# Patient Record
Sex: Female | Born: 1975
Health system: Southern US, Community
[De-identification: ages and names within clinical notes are randomized; demographics above are authoritative.]

## PROBLEM LIST (undated history)

## (undated) DIAGNOSIS — J302 Other seasonal allergic rhinitis: Secondary | ICD-10-CM

## (undated) HISTORY — PX: RECONSTRUCTION, SEPTAL: SHX5049

## (undated) HISTORY — DX: Other seasonal allergic rhinitis: J30.2

## (undated) HISTORY — PX: FOOT SURGERY: SHX648

## (undated) HISTORY — PX: DENTAL SURGERY: SHX609

---

## 2015-01-05 ENCOUNTER — Ambulatory Visit (INDEPENDENT_AMBULATORY_CARE_PROVIDER_SITE_OTHER): Payer: No Typology Code available for payment source | Admitting: Family

## 2015-01-05 ENCOUNTER — Encounter (INDEPENDENT_AMBULATORY_CARE_PROVIDER_SITE_OTHER): Payer: Self-pay

## 2015-01-05 VITALS — BP 124/68 | HR 77 | Temp 99.4°F | Ht 60.0 in | Wt 105.0 lb

## 2015-01-05 DIAGNOSIS — B349 Viral infection, unspecified: Secondary | ICD-10-CM

## 2015-01-05 DIAGNOSIS — R52 Pain, unspecified: Secondary | ICD-10-CM

## 2015-01-05 NOTE — Patient Instructions (Signed)
Viral Syndrome (Adult)  A viral illness may cause a number of symptoms. The symptoms depend on the part of the body that the virus affects. If it settles in the nose, throat, and lungs, it may cause cough, sore throat, congestion, and sometimes headache. If it settles in the stomach and intestinal tract, it may cause vomiting and diarrhea. Sometimes it causes vague symptoms like "aching all over," feeling tired, loss of appetite, or fever.  A viral illness usually lasts1 to 2 weeks, but sometimes it lasts longer. In some cases, a more serious infection can look like a viral syndrome in the first few days of the illness. You may need anotherexam and additional teststo know the difference.Watch for the warning signs listed below.  Home care  Follow these guidelines for taking care of yourself at home:   If symptoms are severe, rest at home for the first 2 to 3 days.   Stay away from cigarette smoke - both your smoke and the smoke from others.   You may useacetaminophen or ibuprofen for fever, muscle aching, and headache, unless another medicine was prescribed for this.If you have chronic liver or kidney disease or ever had a stomach ulcer or GI bleeding, talk with your doctor before using these medicinesNo one who is younger than 71 and ill with a fever should take aspirin. It may cause severe liver damage.   Your appetite may be poor, so a light diet is fine. Avoid dehydration by drinking 8 to 12 8-ounce glasses of fluids each day. This may include water; orange juice; lemonade; apple, grape, and cranberry juice; clear fruit drinks; electrolyte replacement and sports drinks; and decaffeinated teas and coffee. If you have been diagnosed with a kidney disease, ask your doctor how much and what types of fluids you should drink to prevent dehydration. If you have kidney disease, drinking too much fluid can cause it build up in the your body and be dangerous to your health.   Over-the-counter remedies won't  shorten the length of the illness but may be helpful forcough, sore throat; and nasal and sinus congestion. Don't use decongestants if you have high blood pressure.  Follow-up care  Follow up with your health care provider if you do not improve over the next week.  When to seek medical advice  Call your health care provider right awayif any of these occur:   Cough with lots of colored sputum (mucus) or blood in your sputum   Chest pain, shortness of breath, wheezing, or difficulty breathing   Severe headache; face, neck, or ear pain   Severe, constant pain in the lower right side of your belly (abdominal)   Continued vomiting (can't keep liquids down)   Frequent diarrhea (more than 5 times a day); blood (red or black color) or mucus in diarrhea   Feeling weak, dizzy, or like you are going to faint   Extreme thirst   Fever of 100.4 F (38 C) oral or higher, not better with fever medication   [employer]   2000-2015 [employer] [address] [place], [place] [address]. All rights reserved. This information is not intended as a substitute for professional medical care. Always follow your healthcare professional's instructions.    Follow up with Primary physician for concerns about zika or the department of health.

## 2015-01-05 NOTE — Progress Notes (Signed)
Subjective:       Patient ID: Leah Morgan is a 39 y.o. female.    HPI Comments: Pt presents with c/o body aches in upper arms for 2-3 days.   She had a low grade fever yesterday of 100.0.   She denies any cold symptoms.   She is trying to get pregnant. Worried about zika, given exposure to mosquito bite while in [place] about a month ago for her honeymoon.             Review of Systems   Constitutional: Positive for fever. Negative for chills, diaphoresis, activity change, appetite change and fatigue.   HENT: Positive for ear pain. Negative for congestion, drooling, ear discharge and facial swelling.    Eyes: Negative.  Negative for pain, discharge and itching.   Respiratory: Negative.    Cardiovascular: Negative for chest pain, palpitations and leg swelling.   Gastrointestinal: Negative.    Genitourinary: Negative for dysuria, urgency, frequency, flank pain, decreased urine volume and pelvic pain.   Musculoskeletal: Positive for myalgias. Negative for back pain, joint swelling, neck pain and neck stiffness.   Allergic/Immunologic: Negative for environmental allergies and food allergies.   Neurological: Negative for dizziness, tremors, seizures, syncope, facial asymmetry, speech difficulty, weakness, light-headedness, numbness and headaches.           Objective:     Physical Exam   Constitutional: She appears well-developed.   HENT:   Head: Normocephalic.   Right Ear: Hearing, tympanic membrane and external ear normal.   Left Ear: Hearing, tympanic membrane and external ear normal.   Nose: No mucosal edema, rhinorrhea, nose lacerations, sinus tenderness or nasal deformity.   Mouth/Throat: Uvula is midline, oropharynx is clear and moist and mucous membranes are normal. No oral lesions. No dental abscesses.   Eyes: Conjunctivae, EOM and lids are normal. Pupils are equal, round, and reactive to light.   Neck: Normal range of motion. Normal carotid pulses present. No spinous process tenderness and no muscular  tenderness present. Carotid bruit is not present. No rigidity. No edema, no erythema and normal range of motion present.   Cardiovascular: Normal rate and normal pulses.    Pulmonary/Chest: Effort normal and breath sounds normal.   Abdominal: Soft.   Musculoskeletal: Normal range of motion. She exhibits no edema.        Right shoulder: She exhibits tenderness, pain and abnormal pulse. She exhibits normal range of motion, no bony tenderness, no swelling, no effusion, no crepitus, no deformity, no laceration, no spasm and normal strength.   Bilateral arm muscular tenderness with palpation of muscles around biceps and triceps.    Lymphadenopathy:     She has no cervical adenopathy.     She has no axillary adenopathy.   Neurological: She is alert. She displays no atrophy, no tremor and normal reflexes. No cranial nerve deficit or sensory deficit. She exhibits normal muscle tone. Coordination normal.   Skin: Skin is warm. No rash noted. She is not diaphoretic. No erythema.   Psychiatric: She has a normal mood and affect. Her speech is normal and behavior is normal. Thought content normal.           Assessment:       Viral syndrome, body aches      Plan:       Tylenol as needed.   Primary physician list for [place] given to patient. F/u and schedule with PMD or with health department for [facility] testing.  Subjective:      Leah Morgan is a 39 y.o. female who presents for evaluation of bilateral hand/finger pain. Onset was sudden, not related to any specific activity. The pain is moderate, worsens with movement, and is relieved by rest. There is no associated numbness. Evaluation to date: none. Treatment to date: nothing specific.    The following portions of the patient's history were reviewed and updated as appropriate: She  has a past medical history of Seasonal allergic rhinitis..    Review of Systems  Pertinent items are noted in HPI.      Objective:        Right hand:  Tenderness of bilateral hands, muscle aches.  ROM intact   Left hand:  Tenderness of bilateral hands, muscle aches. ROM intact         Assessment:      viral syndrome      Plan:      OTC analgesics as needed.    F/u with PMD as listed for further testing as needed if symptoms persist or worsen

## 2015-01-05 NOTE — Progress Notes (Signed)
Patient feels as if she worked out but has not. Went dancing this past weekend but does not know whether it could be related to that.

## 2015-01-06 ENCOUNTER — Telehealth (INDEPENDENT_AMBULATORY_CARE_PROVIDER_SITE_OTHER): Payer: Self-pay

## 2015-01-06 LAB — POCT PREGNANCY TEST, URINE HCG: POCT Pregnancy HCG Test, UR: NEGATIVE

## 2015-01-06 NOTE — Telephone Encounter (Signed)
F/U Call, pt feeling about the same. Advised pt to try and rest for the next couple days and f/u if needed

## 2018-07-14 ENCOUNTER — Ambulatory Visit (INDEPENDENT_AMBULATORY_CARE_PROVIDER_SITE_OTHER): Payer: No Typology Code available for payment source | Admitting: Family Nurse Practitioner

## 2018-07-14 ENCOUNTER — Ambulatory Visit (INDEPENDENT_AMBULATORY_CARE_PROVIDER_SITE_OTHER): Payer: No Typology Code available for payment source

## 2018-07-14 ENCOUNTER — Encounter (INDEPENDENT_AMBULATORY_CARE_PROVIDER_SITE_OTHER): Payer: Self-pay

## 2018-07-14 VITALS — BP 106/66 | HR 76 | Temp 98.9°F | Resp 16 | Ht 60.0 in | Wt 103.0 lb

## 2018-07-14 DIAGNOSIS — R059 Cough, unspecified: Secondary | ICD-10-CM

## 2018-07-14 DIAGNOSIS — R05 Cough: Secondary | ICD-10-CM

## 2018-07-14 DIAGNOSIS — J209 Acute bronchitis, unspecified: Secondary | ICD-10-CM

## 2018-07-14 MED ORDER — BENZONATATE 100 MG PO CAPS
100.00 mg | ORAL_CAPSULE | Freq: Three times a day (TID) | ORAL | 0 refills | Status: AC | PRN
Start: 2018-07-14 — End: 2018-07-21

## 2018-07-14 MED ORDER — ALBUTEROL SULFATE HFA 108 (90 BASE) MCG/ACT IN AERS
2.0000 | INHALATION_SPRAY | Freq: Four times a day (QID) | RESPIRATORY_TRACT | 0 refills | Status: DC | PRN
Start: 2018-07-14 — End: 2018-07-24

## 2018-07-14 MED ORDER — ALBUTEROL-IPRATROPIUM 2.5-0.5 (3) MG/3ML IN SOLN
3.00 mL | Freq: Once | RESPIRATORY_TRACT | Status: AC
Start: 2018-07-14 — End: 2018-07-14
  Administered 2018-07-14: 13:00:00 3 mL via RESPIRATORY_TRACT

## 2018-07-14 NOTE — Patient Instructions (Signed)
Acute Bronchitis  Your healthcare provider has told you that you have acute bronchitis. Bronchitis is infection or inflammation of the airways in the lungs (bronchial tubes). Normally, air moves easily in and out of the airways. Bronchitis narrows the airways. This makes it harder for air to flow in and out of the lungs. This causes symptoms such as shortness of breath, coughing up yellow or green mucus, and wheezing.   Bronchitis can be acute or chronic. Acute means it happens quickly and goes away in a short time. Chronic means a condition lasts a long time and often comes back. Most people with acute bronchitis get better in 1 to 2 weeks.     What causes acute bronchitis?  Acute bronchitis is often caused by a virus such as a cold or the flu. In some cases, it may be caused by bacteria. Certain factors make it more likely for a cold or flu to turn into bronchitis. These include being very young, being elderly, having a heart or lung problem, or having a weak immune system. Cigarette smoking also makes bronchitis more likely.   When bronchitis develops, the airways become swollen. The airways may also become infected with bacteria. This is known as a secondary infection.   Symptoms of acute bronchitis  Symptoms can include:   Coughing with mucus   Wheezing   Feeling short of breath   Chest pain   Fever  Diagnosing acute bronchitis  Your healthcare provider will ask about your symptoms and health history. He or she will give you a physical exam. This will include listening to your lungs while you breathe. You may have a chest X-ray to look for infection in the lungs (pneumonia) if you have had a fever. You may also have blood tests to check for infection.   Treating acute bronchitis  Bronchitis usually goes away in 1 to 2 weeks without treatment. You can help feel better by:    Taking medicine as directed. Talk to your healthcare provider before taking any over-the-counter medicines (OTC). Some OTC  medicines help relieve inflammation in your bronchial tubes. They can also thin mucus. This makes it easier to cough up. Your healthcare provider may prescribe an inhaler to help open up the bronchial tubes. Most of the time,acute bronchitisis caused by a viral infection. Antibiotics are usually not prescribed for viral infections.   Drinking plenty of fluids, such as water, juice, or warm soup. Fluids loosen mucus so that you can cough it up. This helps you breathe more easily. Fluids also prevent dehydration.   Using a humidifier. This can help reduce coughing.   Getting plenty of rest   Not smoking. Also, don't let anyone else smoke in your home. In public places, move away from secondhand smoke.  Recovery and follow-up  Follow up with your doctor. You will likely feel better in 1 to 2 weeks. But you may have a dry cough for a longer time. Let your doctor know if you still have symptoms other than a dry cough after 2 weeks. Tell him or her if you get bronchial infections often.   Self-care tips  To get relief from your symptoms and prevent bronchitis:   Stop smoking. Stopping smoking is the most important step you can take to treat bronchitis. If you need help stopping smoking, talk with your healthcare provider.   Stay away from secondhand smoke and other irritants. Try to stay away from smoke, chemicals, fumes, and dust. Don't let anyone smoke in   your home. Stay indoors on smoggy days.   Prevent lung infections. Ask your healthcare provider about the flu and pneumonia vaccines. Take steps to prevent colds and other lung infections.   Wash your hands well. Wash your hands often with soap and water. Use hand sanitizer when you can't wash your hands. Stay away from crowds during cold and flu season.  When to call your healthcare provider  Call the healthcare provider if you have any of these:    Fever of 100.4F ( 38.0C) or higher, or as directed by your healthcare provider   Symptoms that get worse,  or new symptoms   Breathing not getting better with treatments   Symptoms that don't start to get better in 1 week  [employer] last reviewed this educational content on 01/17/2018   2000-2019 [employer]. [address], [place] [address]. All rights reserved. This information is not intended as a substitute for professional medical care. Always follow your healthcare professional's instructions.

## 2018-07-14 NOTE — Progress Notes (Signed)
[facility]  PROGRESS NOTE     Patient: Leah Morgan   Date: 07/14/2018   MRN: [id number]       Leah Morgan is a 43 y.o. female      HISTORY     Chief Complaint   Patient presents with   . Cough     c/o dry cough x4 days.  she says it feels like it's settling in her chest.          Cough   This is a new problem. Episode onset: 4 days. The problem has been unchanged. The problem occurs constantly. The cough is non-productive. Associated symptoms include rhinorrhea and wheezing. Pertinent negatives include no chest pain, chills, ear congestion, ear pain, fever, headaches, heartburn, hemoptysis, myalgias, nasal congestion, postnasal drip, rash, sore throat, shortness of breath, sweats or weight loss. Nothing aggravates the symptoms. She has tried nothing for the symptoms. The treatment provided no relief. There is no history of asthma, bronchiectasis, bronchitis, COPD, emphysema, environmental allergies or pneumonia.       Review of Systems   Constitutional: Negative for chills, fever and weight loss.   HENT: Positive for rhinorrhea. Negative for ear pain, postnasal drip and sore throat.    Eyes: Negative.    Respiratory: Positive for cough and wheezing. Negative for hemoptysis and shortness of breath.    Cardiovascular: Negative for chest pain.   Gastrointestinal: Negative for heartburn.   Musculoskeletal: Negative for myalgias.   Skin: Negative for rash.   Allergic/Immunologic: Negative for environmental allergies.   Neurological: Negative for headaches.       History:  History reviewed. No pertinent past medical history.    History reviewed. No pertinent surgical history.    History reviewed. No pertinent family history.    Social History     Tobacco Use   . Smoking status: Never Smoker   . Smokeless tobacco: Never Used   Substance Use Topics   . Alcohol use: Yes     Comment: rarely   . Drug use: Never       History reviewed.        Current Outpatient Medications:   .  Ascorbic Acid (VITAMIN C) 100 MG  tablet, Take 100 mg by mouth daily, Disp: , Rfl:   .  guaifenesin (ROBITUSSIN) 100 MG/5ML liquid, Take 200 mg by mouth 3 (three) times daily as needed for Cough, Disp: , Rfl:   .  albuterol (PROVENTIL HFA) 108 (90 Base) MCG/ACT inhaler, Inhale 2 puffs into the lungs every 6 (six) hours as needed for Wheezing or Shortness of Breath, Disp: 1 Inhaler, Rfl: 0  .  benzonatate (TESSALON) 100 MG capsule, Take 1 capsule (100 mg total) by mouth 3 (three) times daily as needed for Cough, Disp: 21 capsule, Rfl: 0  No current facility-administered medications for this visit.     No Known Allergies    Medications and Allergies reviewed.    PHYSICAL EXAM     Vitals:    07/14/18 1146   BP: 106/66   Pulse: 76   Resp: 16   Temp: 98.9 F (37.2 C)   TempSrc: Tympanic   SpO2: 98%   Weight: 46.7 kg (103 lb)   Height: 1.524 m (5')       Physical Exam   Nursing note and vitals reviewed.  Constitutional: She is oriented to person, place, and time. Vital signs are normal. She appears well-developed and well-nourished. No distress.   HENT:   Head: Normocephalic and  atraumatic.   Nose: Nose normal.   Mouth/Throat: Oropharynx is clear and moist. No oropharyngeal exudate.   Eyes: Conjunctivae and EOM are normal. Pupils are equal, round, and reactive to light. No scleral icterus.   Neck: Normal range of motion. Neck supple. Carotid bruit is not present. No thyroid mass and no thyromegaly present.   Cardiovascular: Normal rate, regular rhythm, normal heart sounds, intact distal pulses and normal pulses.   No murmur heard.  Pulmonary/Chest: Effort normal. No respiratory distress. She has wheezes in the right upper field and the left upper field. She has no rales. She exhibits no tenderness.   Abdominal: Soft. Normal appearance and bowel sounds are normal. She exhibits no distension and no mass. There is no abdominal tenderness.   Musculoskeletal: Normal range of motion.   Lymphadenopathy:     She has no cervical adenopathy.   Neurological: She is  alert and oriented to person, place, and time. She has normal strength and normal reflexes. No cranial nerve deficit or sensory deficit. She displays a negative Romberg sign. Gait normal.   Skin: Skin is warm, dry and intact. No rash noted.   Psychiatric: She has a normal mood and affect. Her speech is normal and behavior is normal.         UCC COURSE       Results     ** No results found for the last 24 hours. **            Xr Chest 2 Views    Result Date: 07/14/2018  Examination: Frontal lateral chest. HISTORY: Cough. COMPARISON: None. FINDINGS: Lungs, costophrenic angles clear. Heart size normal. Bones no acute process.     Normal. Adaline Sill, MD 07/14/2018 12:48 PM        Orders Placed This Encounter   Medications   . albuterol-ipratropium (DUO-NEB) 2.5-0.5(3) mg/3 mL nebulizer 3 mL   . albuterol (PROVENTIL HFA) 108 (90 Base) MCG/ACT inhaler     Sig: Inhale 2 puffs into the lungs every 6 (six) hours as needed for Wheezing or Shortness of Breath     Dispense:  1 Inhaler     Refill:  0   . benzonatate (TESSALON) 100 MG capsule     Sig: Take 1 capsule (100 mg total) by mouth 3 (three) times daily as needed for Cough     Dispense:  21 capsule     Refill:  0         PROCEDURES     Procedures       ASSESSMENT     Encounter Diagnoses   Name Primary?   . Cough Yes   . Acute bronchitis, unspecified organism           SSESSMENT    PLAN      Patient reports feeling better after breathing treatment and lungs clear. Chest xray was negative for pneumonia. Suspect viral illness. Will start on tessalon pearles and albuterol inhaler. Monitor for worsening symptoms.     Discussed results and diagnosis with patient/family.  Reviewed warning signs for worsening condition, as well as, indications for follow-up with pmd and return to urgent care clinic.   Patient/family expressed understanding of instructions.    Orders Placed This Encounter   Procedures   . XR Chest 2 Views         An After Visit Summary was printed and given to  the patient.      Signed,  Thompson Caul, FNP

## 2018-07-17 ENCOUNTER — Encounter (INDEPENDENT_AMBULATORY_CARE_PROVIDER_SITE_OTHER): Payer: Self-pay | Admitting: Family Nurse Practitioner

## 2018-07-17 ENCOUNTER — Ambulatory Visit (INDEPENDENT_AMBULATORY_CARE_PROVIDER_SITE_OTHER): Payer: No Typology Code available for payment source | Admitting: Family Nurse Practitioner

## 2018-07-17 VITALS — BP 106/71 | HR 80 | Temp 98.6°F | Resp 18 | Ht 60.0 in | Wt 103.0 lb

## 2018-07-17 DIAGNOSIS — J0100 Acute maxillary sinusitis, unspecified: Secondary | ICD-10-CM

## 2018-07-17 DIAGNOSIS — J209 Acute bronchitis, unspecified: Secondary | ICD-10-CM

## 2018-07-17 MED ORDER — AMOXICILLIN-POT CLAVULANATE 875-125 MG PO TABS
1.0000 | ORAL_TABLET | Freq: Two times a day (BID) | ORAL | 0 refills | Status: AC
Start: 2018-07-17 — End: 2018-07-27

## 2018-07-17 NOTE — Progress Notes (Signed)
[facility]  PROGRESS NOTE     Patient: Leah Morgan   Date: 07/17/2018   MRN: [id number]       Leah Morgan is a 43 y.o. female      HISTORY     Chief Complaint   Patient presents with   . Cough     c/o coughing that is not getting better.  she is having difficult to breath.  she is feeling weak.  she has been home from work because she doesn't want to get anyone else sick.  she says she cough non stop.  needs a note so she can work from home right now.  would like something to help with the cough.          Cough   This is a new problem. The current episode started in the past 7 days. The problem has been gradually worsening. The problem occurs constantly. The cough is non-productive. Associated symptoms include ear congestion, nasal congestion, postnasal drip and rhinorrhea. Pertinent negatives include no chest pain, chills, ear pain, fever, headaches, heartburn, hemoptysis, myalgias, rash, sore throat, shortness of breath, sweats, weight loss or wheezing. Nothing aggravates the symptoms. Treatments tried: tessalon pearles. The treatment provided no relief.   Sinus Problem   This is a new problem. The current episode started in the past 7 days. The problem has been gradually worsening since onset. There has been no fever. Her pain is at a severity of 4/10. The pain is mild. Associated symptoms include coughing and sinus pressure. Pertinent negatives include no chills, congestion, diaphoresis, ear pain, headaches, hoarse voice, neck pain, shortness of breath, sneezing, sore throat or swollen glands. The treatment provided no relief.       Review of Systems   Constitutional: Negative for chills, diaphoresis, fever and weight loss.   HENT: Positive for postnasal drip, rhinorrhea, sinus pressure and sinus pain. Negative for congestion, ear pain, hoarse voice, sneezing and sore throat.    Eyes: Negative.    Respiratory: Positive for cough. Negative for apnea, hemoptysis, choking, chest tightness, shortness of  breath and wheezing.    Cardiovascular: Negative for chest pain.   Gastrointestinal: Negative for heartburn.   Musculoskeletal: Negative for myalgias and neck pain.   Skin: Negative for rash.   Neurological: Negative for headaches.       History:  History reviewed. No pertinent past medical history.    History reviewed. No pertinent surgical history.    History reviewed. No pertinent family history.    Social History     Tobacco Use   . Smoking status: Never Smoker   . Smokeless tobacco: Never Used   Substance Use Topics   . Alcohol use: Yes     Comment: rarely   . Drug use: Never       History reviewed.        Current Outpatient Medications:   .  albuterol (PROVENTIL HFA) 108 (90 Base) MCG/ACT inhaler, Inhale 2 puffs into the lungs every 6 (six) hours as needed for Wheezing or Shortness of Breath, Disp: 1 Inhaler, Rfl: 0  .  Ascorbic Acid (VITAMIN C) 100 MG tablet, Take 100 mg by mouth daily, Disp: , Rfl:   .  benzonatate (TESSALON) 100 MG capsule, Take 1 capsule (100 mg total) by mouth 3 (three) times daily as needed for Cough, Disp: 21 capsule, Rfl: 0  .  guaifenesin (ROBITUSSIN) 100 MG/5ML liquid, Take 200 mg by mouth 3 (three) times daily as needed for Cough,  Disp: , Rfl:   .  amoxicillin-clavulanate (AUGMENTIN) 875-125 MG per tablet, Take 1 tablet by mouth 2 (two) times daily for 10 days, Disp: 20 tablet, Rfl: 0    No Known Allergies    Medications and Allergies reviewed.    PHYSICAL EXAM     Vitals:    07/17/18 1658   BP: 106/71   Pulse: 80   Resp: 18   Temp: 98.6 F (37 C)   TempSrc: Oral   SpO2: 98%   Weight: 46.7 kg (103 lb)   Height: 1.524 m (5')       Physical Exam   Nursing note and vitals reviewed.  Constitutional: She is oriented to person, place, and time. Vital signs are normal. She appears well-developed and well-nourished. No distress.   HENT:   Head: Normocephalic and atraumatic.   Right Ear: Tympanic membrane is bulging.   Left Ear: Tympanic membrane is bulging.   Nose: Nose normal.    Mouth/Throat: Oropharynx is clear and moist. No oropharyngeal exudate.   Maxillary sinus pressure and pain to palpation, excessive post nasal drainage noted to posterior pharynx    Eyes: Conjunctivae and EOM are normal. Pupils are equal, round, and reactive to light. No scleral icterus.   Neck: Normal range of motion. Neck supple. Carotid bruit is not present. No thyroid mass and no thyromegaly present.   Cardiovascular: Normal rate, regular rhythm, normal heart sounds, intact distal pulses and normal pulses.   No murmur heard.  Pulmonary/Chest: Effort normal and breath sounds normal. No respiratory distress. She has no wheezes.   Abdominal: Soft. Normal appearance and bowel sounds are normal. She exhibits no distension and no mass. There is no abdominal tenderness.   Musculoskeletal: Normal range of motion.   Lymphadenopathy:     She has no cervical adenopathy.   Neurological: She is alert and oriented to person, place, and time. She has normal strength and normal reflexes. No cranial nerve deficit or sensory deficit. She displays a negative Romberg sign. Gait normal.   Skin: Skin is warm, dry and intact. No rash noted.   Psychiatric: She has a normal mood and affect. Her speech is normal and behavior is normal.         UCC COURSE       Results     ** No results found for the last 24 hours. **            No results found.      Orders Placed This Encounter   Medications   . amoxicillin-clavulanate (AUGMENTIN) 875-125 MG per tablet     Sig: Take 1 tablet by mouth 2 (two) times daily for 10 days     Dispense:  20 tablet     Refill:  0         PROCEDURES     Procedures       ASSESSMENT     Encounter Diagnoses   Name Primary?   . Acute bronchitis, unspecified organism Yes   . Acute non-recurrent maxillary sinusitis           SSESSMENT    PLAN      Lungs clear, concerned for sinus infection due to exam findings. Will start on augmentin. Continue to use albuterol inhaler and tessalon perales as needed. Patient declined  prescription for stronger cough medication. Use sudafed. Monitor for worsening symptoms.     Discussed results and diagnosis with patient/family.  Reviewed warning signs for worsening condition, as well as, indications  for follow-up with pmd and return to urgent care clinic.   Patient/family expressed understanding of instructions.    No orders of the defined types were placed in this encounter.        An After Visit Summary was printed and given to the patient.      Signed,  Thompson Caul, FNP

## 2018-07-17 NOTE — Patient Instructions (Signed)
Start using augmentin twice a day and take with food. Start using sudafed to dry post nasal drainage. Continue to use tessalon pearles and albuterol inhaler as needed. Please seek treatment for worsening cough, shortness of breathe, fevers.         Understanding Acute Rhinosinusitis  Acute rhinosinusitis iswhen the lining of the inside of the nose and the sinuses becomes irritated and swollen. It is also called sinusitis, or a sinus infection.   Sinuses are air-filled spaces in the skull behind the face. They are kept moist and clean by a lining of mucosa. Things such as pollen, smoke, and chemical fumes can irritate the mucosa. It can then swell up. As a response to irritation, the mucosa makes more mucus and other fluids. Tiny hairlike cilia cover the mucosa. Cilia help carry mucus toward the opening of the sinus. Too much mucus may cause the cilia to stop working. This blocks the sinus opening. A buildup of fluid in the sinuses then causes pain and pressure. It can also cause bacteria to grow in the sinuses.     What causes acute rhinosinusitis?  A sinus infection is most often caused by a virus. You are more likely to get one after having a cold or the flu. In some cases, a sinus infection can be caused by bacteria.   You are at higher risk for a sinus infection if you:    Are older in age   Have structural problems with your sinuses   Smoke or are exposed to secondhand smoke   Are exposed to changes in pressure, such as from flying a lot or deep sea diving   Have asthma or allergies   Have a weak immune system   Have dental disease  Symptoms of acute rhinosinusitis  Symptoms of acute rhinosinusitis often last around 7 to 10 days. If you have a bacterial infection, they may last longer. They may also get better but then worsen. You may have:    Facepain or pressure under the eyes and around the nose   Headache   Fluid draining in the back of the throat (postnasal drip)   Congestion   Drainage that  is thick and colored (often green), instead of clear   Cough   Problems with your sense of smell   Ear pain or hearing problems   Fever   Tooth pain   Fatigue  Diagnosing acute rhinosinusitis  Yourhealthcare provider will ask about your symptoms and past health.He or she will look at your ears, nose, throat, and sinuses. Imaging tests, such as X-rays, are often not needed.   It can be hard to figure out if a sinus infection is caused by a virus or bacterium. A bacterial infection tends to last longer. Symptoms may also get better but then worsen. Your healthcare provider may take asample of mucus from your nose to check for bacteria.   Treating acute rhinosinusitis  Most sinus infections will go away within 10 days. Your body will fight off the virus. If your symptoms seem to get better but then worsen, you may have a bacterial infection instead. Your healthcare provider will then give you antibiotics. Take this medicine until it is gone, even if you feel better.   To help ease your symptoms, your healthcare provider may advise:    Over-the-counter pain relievers. Medicines such as acetaminophen or ibuprofen can ease sinus pain. They may also lower a fever.   Nasal washes. Washing your nasal passages with salt water  may ease pain and pressure. It can rinse out mucous and other irritants from your sinuses. Your healthcare provider can show you how to do it.   Nasal steroid spray. This prescription medicine can reduce inflammation in your sinuses.   Other medicines. Decongestants, antihistamines, and other nasal sprays may give short-term relief. They may help with congestion. Talk with your healthcare provider before taking these medicines.    Preventing acute rhinosinusitis  You can help prevent a sinus infection with these steps:    Wash your hands well and often.   Stay away from people who have a cold or upper respiratory infection.   Don't smoke. And stay away from secondhand smoke.   Use a  humidifier at home.   Make sure you are up-to-date on your vaccines, such as the flu shot.    When to call your healthcare provider  Call your healthcare provider right away if you have any of these:    Fever of 100.7F (38C) or higher, or as directed by your healthcare provider   Pain that gets worse   Symptoms that don't get better, or get worse   New symptoms  [employer] last reviewed this educational content on 11/17/2017   2000-2019 [employer], [place]. [address], [place], [place] [address]. All rights reserved. This information is not intended as a substitute for professional medical care. Always follow your healthcare professional's instructions.

## 2018-09-30 ENCOUNTER — Encounter (INDEPENDENT_AMBULATORY_CARE_PROVIDER_SITE_OTHER): Payer: Self-pay

## 2019-03-31 ENCOUNTER — Ambulatory Visit (INDEPENDENT_AMBULATORY_CARE_PROVIDER_SITE_OTHER): Payer: No Typology Code available for payment source | Admitting: Family Medicine

## 2019-04-02 ENCOUNTER — Ambulatory Visit (INDEPENDENT_AMBULATORY_CARE_PROVIDER_SITE_OTHER): Payer: No Typology Code available for payment source | Admitting: Family Medicine

## 2019-04-09 ENCOUNTER — Ambulatory Visit (INDEPENDENT_AMBULATORY_CARE_PROVIDER_SITE_OTHER): Payer: No Typology Code available for payment source | Admitting: Family Medicine

## 2019-04-14 ENCOUNTER — Encounter (INDEPENDENT_AMBULATORY_CARE_PROVIDER_SITE_OTHER): Payer: Self-pay | Admitting: Family Medicine

## 2019-04-14 ENCOUNTER — Ambulatory Visit (INDEPENDENT_AMBULATORY_CARE_PROVIDER_SITE_OTHER): Payer: No Typology Code available for payment source | Admitting: Family Medicine

## 2019-04-14 VITALS — BP 84/58 | HR 68 | Temp 98.3°F | Resp 16 | Ht 58.5 in | Wt 108.0 lb

## 2019-04-14 DIAGNOSIS — M791 Myalgia, unspecified site: Secondary | ICD-10-CM

## 2019-04-14 DIAGNOSIS — R1013 Epigastric pain: Secondary | ICD-10-CM

## 2019-04-14 DIAGNOSIS — Z833 Family history of diabetes mellitus: Secondary | ICD-10-CM

## 2019-04-14 DIAGNOSIS — R5383 Other fatigue: Secondary | ICD-10-CM

## 2019-04-14 DIAGNOSIS — Z0000 Encounter for general adult medical examination without abnormal findings: Secondary | ICD-10-CM | POA: Insufficient documentation

## 2019-04-14 DIAGNOSIS — L659 Nonscarring hair loss, unspecified: Secondary | ICD-10-CM

## 2019-04-14 DIAGNOSIS — D259 Leiomyoma of uterus, unspecified: Secondary | ICD-10-CM | POA: Insufficient documentation

## 2019-04-14 NOTE — Progress Notes (Signed)
Subjective:      Patient ID: Leah Morgan is a 43 y.o. female.    Chief Complaint:  Chief Complaint   Patient presents with   . Annual Exam       HPI:  New patient here for PE, 43 yo married woman with 2 kids, originally from [place]. Generally healthy, h/o uterine fibroid, FH DM gets some exercise, but limited due to kids and work. Currently with some epigastric pain intermittently , prev H pylori when in [place] , treated with resolution. Also currently with fatigue, some insomnia, very stressed at work, seeing psychologist, and experiencing some hair loss. Having signif bodyaches since stress level increased.  Tdap 2018, got flu shot this year.       Problem List:  Patient Active Problem List   Diagnosis   . Well adult exam   . Uterine fibroid   . Family history of diabetes mellitus type II       Current Medications:  Current Outpatient Medications   Medication Sig Dispense Refill   . Ascorbic Acid (VITAMIN C) 100 MG tablet Take 100 mg by mouth daily     . Prenatal Vit-Fe Fumarate-FA (PRENATAL VITAMIN PO) Take 1 tablet by mouth       No current facility-administered medications for this visit.        Allergies:  No Known Allergies    Past Medical History:  Past Medical History:   Diagnosis Date   . Seasonal allergic rhinitis        Past Surgical History:  Past Surgical History:   Procedure Laterality Date   . FOOT SURGERY         Family History:  Family History   Problem Relation Age of Onset   . Hypertension Mother    . Hypertension Father    . Hyperlipidemia Father    . Diabetes Maternal Grandmother    . Diabetes Maternal Grandfather    . Diabetes Paternal Grandmother        Social History:  Social History     Socioeconomic History   . Marital status: Single     Spouse name: Not on file   . Number of children: Not on file   . Years of education: Not on file   . Highest education level: Not on file   Occupational History   . Not on file   Social Needs   . Financial resource strain: Not on file   . Food  insecurity     Worry: Not on file     Inability: Not on file   . Transportation needs     Medical: Not on file     Non-medical: Not on file   Tobacco Use   . Smoking status: Never Smoker   . Smokeless tobacco: Never Used   Substance and Sexual Activity   . Alcohol use: Yes     Comment: 4 drinks per week   . Drug use: Never   . Sexual activity: Not on file   Lifestyle   . Physical activity     Days per week: Not on file     Minutes per session: Not on file   . Stress: Not on file   Relationships   . Social Wellsite geologist on phone: Not on file     Gets together: Not on file     Attends religious service: Not on file     Active member of club or organization: Not on  file     Attends meetings of clubs or organizations: Not on file     Relationship status: Not on file   . Intimate partner violence     Fear of current or ex partner: Not on file     Emotionally abused: Not on file     Physically abused: Not on file     Forced sexual activity: Not on file   Other Topics Concern   . Not on file   Social History Narrative    ** Merged History Encounter **            The following sections were reviewed this encounter by the provider:        ROS:  Review of Systems   Constitutional: Negative for activity change, appetite change, fatigue and unexpected weight change.   HENT: Negative for congestion, hearing loss and sore throat.    Eyes: Negative for visual disturbance.   Respiratory: Negative for cough, chest tightness, shortness of breath and wheezing.    Cardiovascular: Negative for chest pain, palpitations and leg swelling.   Gastrointestinal: Negative for abdominal distention, abdominal pain, anal bleeding, blood in stool, constipation, diarrhea, nausea and vomiting.   Endocrine: Negative for cold intolerance, heat intolerance, polydipsia and polyuria.   Genitourinary: Negative for difficulty urinating, frequency and menstrual problem.   Musculoskeletal: Positive for myalgias. Negative for arthralgias.    Neurological: Negative for dizziness, weakness, numbness and headaches.   Hematological: Negative for adenopathy.   Psychiatric/Behavioral: Positive for sleep disturbance. Negative for dysphoric mood. The patient is nervous/anxious.        Vitals:  BP (!) 84/58 (BP Site: Left arm, Patient Position: Sitting, Cuff Size: Medium)   Pulse 68   Temp 98.3 F (36.8 C) (Temporal)   Resp 16   Ht 1.486 m (4' 10.5")   Wt 49 kg (108 lb)   LMP 04/13/2019   BMI 22.19 kg/m      Objective:     Physical Exam:  Physical Exam  Constitutional:       Appearance: Normal appearance. She is normal weight.   HENT:      Head: Normocephalic.      Right Ear: Tympanic membrane, ear canal and external ear normal.      Left Ear: Tympanic membrane, ear canal and external ear normal.      Nose: Nose normal.      Mouth/Throat:      Mouth: Mucous membranes are moist.      Pharynx: Oropharynx is clear.   Eyes:      Extraocular Movements: Extraocular movements intact.      Conjunctiva/sclera: Conjunctivae normal.      Pupils: Pupils are equal, round, and reactive to light.   Neck:      Musculoskeletal: Normal range of motion and neck supple.   Cardiovascular:      Rate and Rhythm: Normal rate and regular rhythm.      Pulses: Normal pulses.      Heart sounds: Normal heart sounds. No murmur. No gallop.    Pulmonary:      Effort: Pulmonary effort is normal.      Breath sounds: Normal breath sounds. No wheezing or rales.   Abdominal:      General: Abdomen is flat. Bowel sounds are normal. There is no distension.      Palpations: Abdomen is soft. There is no mass.      Tenderness: There is no abdominal tenderness.      Hernia: No  hernia is present.   Musculoskeletal: Normal range of motion.   Skin:     General: Skin is warm and dry.   Neurological:      General: No focal deficit present.      Mental Status: She is alert and oriented to person, place, and time.   Psychiatric:         Mood and Affect: Mood normal.         Behavior: Behavior normal.          Thought Content: Thought content normal.         Judgment: Judgment normal.          Assessment:     1. Well adult exam  - CBC and differential; Future  - Comprehensive metabolic panel; Future  - Lipid panel; Future    2. Uterine leiomyoma, unspecified location    3. Fatigue, unspecified type  - CBC and differential; Future  - TSH; Future    4. Myalgia  - Vitamin D,25 OH, Total; Future    5. Family history of diabetes mellitus type II  - Hemoglobin A1C; Future    6. Hair loss  - IRON PROFILE; Future  - Ferritin; Future    7. Epigastric pain  - H. pylori breath test; Future      Plan:   Check blood work , continue with psychologist, try to fit in some exercise, even if 10 min    Jeanette Caprice, MD

## 2019-04-14 NOTE — Patient Instructions (Signed)
Try to get even 10 minutes of exercise or stretching per day, continue with psychologist.

## 2019-04-15 ENCOUNTER — Encounter (INDEPENDENT_AMBULATORY_CARE_PROVIDER_SITE_OTHER): Payer: Self-pay

## 2019-05-02 ENCOUNTER — Encounter (INDEPENDENT_AMBULATORY_CARE_PROVIDER_SITE_OTHER): Payer: Self-pay | Admitting: Family Medicine

## 2019-05-07 LAB — LIPID PANEL
Cholesterol / HDL Ratio: 2.8 ratio (ref 0.0–4.4)
Cholesterol: 149 mg/dL (ref 100–199)
HDL: 53 mg/dL (ref >39)
LDL Chol Calculated (NIH): 79 mg/dL (ref 0–99)
Triglycerides: 91 mg/dL (ref 0–149)
VLDL Calculated: 17 mg/dL (ref 5–40)

## 2019-05-07 LAB — COMPREHENSIVE METABOLIC PANEL
ALT: 16 IU/L (ref 0–32)
AST (SGOT): 16 IU/L (ref 0–40)
Albumin/Globulin Ratio: 1.8 (ref 1.2–2.2)
Albumin: 4.6 g/dL (ref 3.8–4.8)
Alkaline Phosphatase: 67 IU/L (ref 39–117)
BUN / Creatinine Ratio: 18 (ref 9–23)
BUN: 12 mg/dL (ref 6–24)
Bilirubin, Total: 0.5 mg/dL (ref 0.0–1.2)
CO2: 24 mmol/L (ref 20–29)
Calcium: 9.6 mg/dL (ref 8.7–10.2)
Chloride: 101 mmol/L (ref 96–106)
Creatinine: 0.68 mg/dL (ref 0.57–1.00)
EGFR: 108 mL/min/{1.73_m2} (ref >59)
EGFR: 125 mL/min/{1.73_m2} (ref >59)
Globulin, Total: 2.6 g/dL (ref 1.5–4.5)
Glucose: 87 mg/dL (ref 65–99)
Potassium: 4.2 mmol/L (ref 3.5–5.2)
Protein, Total: 7.2 g/dL (ref 6.0–8.5)
Sodium: 138 mmol/L (ref 134–144)

## 2019-05-07 LAB — VITAMIN D,25 OH,TOTAL: Vitamin D 25-Hydroxy: 29.1 ng/mL — ABNORMAL LOW (ref 30.0–100.0)

## 2019-05-07 LAB — CBC AND DIFFERENTIAL
Baso(Absolute): 0 10*3/uL (ref 0.0–0.2)
Basos: 0 %
Eos: 2 %
Eosinophils Absolute: 0.1 10*3/uL (ref 0.0–0.4)
Hematocrit: 40.6 % (ref 34.0–46.6)
Hemoglobin: 14 g/dL (ref 11.1–15.9)
Immature Granulocytes Absolute: 0 10*3/uL (ref 0.0–0.1)
Immature Granulocytes: 0 %
Lymphocytes Absolute: 1.2 10*3/uL (ref 0.7–3.1)
Lymphocytes: 31 %
MCH: 31.4 pg (ref 26.6–33.0)
MCHC: 34.5 g/dL (ref 31.5–35.7)
MCV: 91 fL (ref 79–97)
Monocytes Absolute: 0.2 10*3/uL (ref 0.1–0.9)
Monocytes: 6 %
Neutrophils Absolute: 2.3 10*3/uL (ref 1.4–7.0)
Neutrophils: 61 %
Platelets: 196 10*3/uL (ref 150–450)
RBC: 4.46 x10E6/uL (ref 3.77–5.28)
RDW: 12.4 % (ref 11.7–15.4)
WBC: 3.8 10*3/uL (ref 3.4–10.8)

## 2019-05-07 LAB — TSH: TSH: 2.4 u[IU]/mL (ref 0.450–4.500)

## 2019-05-07 LAB — IRON PROFILE
Iron Saturation: 40 % (ref 15–55)
Iron: 126 ug/dL (ref 27–159)
TIBC: 317 ug/dL (ref 250–450)
UIBC: 191 ug/dL (ref 131–425)

## 2019-05-07 LAB — FERRITIN: Ferritin: 75 ng/mL (ref 15–150)

## 2019-05-07 LAB — FISH ONLY

## 2019-05-07 LAB — HEMOGLOBIN A1C: Hemoglobin A1C: 5.4 % (ref 4.8–5.6)

## 2019-05-07 LAB — H. PYLORI BREATH TEST: H. pylori Breath Test: NEGATIVE

## 2019-06-18 ENCOUNTER — Telehealth (INDEPENDENT_AMBULATORY_CARE_PROVIDER_SITE_OTHER): Payer: No Typology Code available for payment source | Admitting: Family Medicine

## 2019-06-18 ENCOUNTER — Encounter (INDEPENDENT_AMBULATORY_CARE_PROVIDER_SITE_OTHER): Payer: Self-pay | Admitting: Family Medicine

## 2019-06-18 DIAGNOSIS — K219 Gastro-esophageal reflux disease without esophagitis: Secondary | ICD-10-CM

## 2019-06-18 DIAGNOSIS — E221 Hyperprolactinemia: Secondary | ICD-10-CM | POA: Insufficient documentation

## 2019-06-18 DIAGNOSIS — K602 Anal fissure, unspecified: Secondary | ICD-10-CM

## 2019-06-18 MED ORDER — HYDROCORTISONE (PERIANAL) 2.5 % EX CREA
TOPICAL_CREAM | Freq: Two times a day (BID) | CUTANEOUS | 2 refills | Status: DC
Start: 2019-06-18 — End: 2019-06-18

## 2019-06-18 MED ORDER — HYDROCORTISONE (PERIANAL) 2.5 % EX CREA
TOPICAL_CREAM | Freq: Two times a day (BID) | CUTANEOUS | 5 refills | Status: DC
Start: 2019-06-18 — End: 2020-02-25

## 2019-06-18 NOTE — Patient Instructions (Signed)
Use cream for anal fissure, push fluids and fiber.   Continue omeprazole for reflux. Check blood work at [email or web address] ( repeat prolactin)

## 2019-06-18 NOTE — Progress Notes (Signed)
Subjective:      Patient ID: Leah Morgan is a 43 y.o. female.    Chief Complaint:  Chief Complaint   Patient presents with   . Rectal Bleeding       HPI:  Verbal consent has been obtained from the patient to conduct this video visit encounter to minimize exposure to COVID-19. Patient was positively identified and was verified to be located in the state of [place] at the time of the visit  Patient with small amount of blood noted with stools in the past few days, now resolved. Also seen once a few months ago, and also after last pregnancy. Once there was one drop of blood in toilet bowl, otherwise only blood with wiping. No FH colon cancer. No bumps felt. Stools are normal consistency, going about once daily. Had some itching yesterday after BM. Did have a painful BM a couple of days ago. There was no blood in stool.   Patient was under signif stress, quit job , doing better, nausea resolved, still with upper abd, lower chest discomfort, taking omeprazole. With improvement. Also with prev hyperprolactinemia, has been having some persisting milk from breast 1 year after stopping breastfeeding, having normal periods.       Problem List:  Patient Active Problem List   Diagnosis   . Well adult exam   . Uterine fibroid   . Family history of diabetes mellitus type II   . Hyperprolactinemia       Current Medications:  Current Outpatient Medications   Medication Sig Dispense Refill   . Ascorbic Acid (VITAMIN C) 100 MG tablet Take 100 mg by mouth daily     . hydrocortisone (ANUSOL-HC) 2.5 % rectal cream Place rectally 2 (two) times daily 30 g 5   . Prenatal Vit-Fe Fumarate-FA (PRENATAL VITAMIN PO) Take 1 tablet by mouth       No current facility-administered medications for this visit.        Allergies:  No Known Allergies    Past Medical History:  Past Medical History:   Diagnosis Date   . Seasonal allergic rhinitis        Past Surgical History:  Past Surgical History:   Procedure Laterality Date   . FOOT SURGERY          Family History:  Family History   Problem Relation Age of Onset   . Hypertension Mother    . Hypertension Father    . Hyperlipidemia Father    . Heart disease Father    . Kidney disease Father    . Diabetes Maternal Grandmother    . Cancer Maternal Grandmother    . Glaucoma Maternal Grandmother    . Diabetes Maternal Grandfather    . Cancer Maternal Grandfather    . Diabetes Paternal Grandmother    . Kidney disease Brother    . Cancer Paternal Aunt        Social History:  Social History     Socioeconomic History   . Marital status: Married     Spouse name: Not on file   . Number of children: 2   . Years of education: Not on file   . Highest education level: Not on file   Occupational History   . Not on file   Social Needs   . Financial resource strain: Not on file   . Food insecurity     Worry: Not on file     Inability: Not on file   . Transportation  needs     Medical: Not on file     Non-medical: Not on file   Tobacco Use   . Smoking status: Never Smoker   . Smokeless tobacco: Never Used   Substance and Sexual Activity   . Alcohol use: Yes     Frequency: 2-4 times a month     Drinks per session: 1 or 2     Binge frequency: Never     Comment: 4 drinks per week   . Drug use: Never   . Sexual activity: Not on file   Lifestyle   . Physical activity     Days per week: Not on file     Minutes per session: Not on file   . Stress: Not on file   Relationships   . Social Wellsite geologist on phone: Not on file     Gets together: Not on file     Attends religious service: Not on file     Active member of club or organization: Not on file     Attends meetings of clubs or organizations: Not on file     Relationship status: Not on file   . Intimate partner violence     Fear of current or ex partner: Not on file     Emotionally abused: Not on file     Physically abused: Not on file     Forced sexual activity: Not on file   Other Topics Concern   . Not on file   Social History Narrative    ** Merged History Encounter **             The following sections were reviewed this encounter by the provider:   Tobacco  Allergies  Meds  Problems  Med Hx  Surg Hx  Fam Hx         ROS:  Review of Systems   Constitutional: Negative for activity change and unexpected weight change.   Eyes: Negative for redness and visual disturbance.   Respiratory: Negative for cough, chest tightness, shortness of breath and wheezing.    Cardiovascular: Positive for chest pain. Negative for palpitations and leg swelling.   Gastrointestinal: Positive for abdominal pain. Negative for abdominal distention, blood in stool and nausea.   Endocrine: Negative for cold intolerance, heat intolerance, polydipsia and polyuria.   Genitourinary: Negative for frequency.   Musculoskeletal: Negative for myalgias.   Neurological: Negative for dizziness, weakness and numbness.   Hematological: Negative for adenopathy.   Psychiatric/Behavioral: Negative for dysphoric mood.       Vitals:  There were no vitals taken for this visit.     Objective:     Physical Exam:  Physical Exam  Constitutional:       General: She is not in acute distress.  HENT:      Head: Normocephalic.   Eyes:      Extraocular Movements: Extraocular movements intact.   Neck:      Musculoskeletal: Normal range of motion.   Pulmonary:      Effort: Pulmonary effort is normal.      Breath sounds: No wheezing.   Skin:     Findings: No rash.   Neurological:      Mental Status: She is alert and oriented to person, place, and time.   Psychiatric:         Mood and Affect: Mood normal.         Behavior: Behavior normal.  Thought Content: Thought content normal.         Judgment: Judgment normal.          Assessment:     1. Anal fissure  - hydrocortisone (ANUSOL-HC) 2.5 % rectal cream; Place rectally 2 (two) times daily  Dispense: 30 g; Refill: 5    2. Hyperprolactinemia  - Prolactin; Future    3. Gastroesophageal reflux disease without esophagitis      Plan:   Continue omeprazole, use cream for likely fissure,  push fluid and fiber, check prolactin.      Jeanette Caprice, MD

## 2019-06-20 HISTORY — PX: OTHER SURGICAL HISTORY: SHX169

## 2019-06-20 HISTORY — PX: DENTAL SURGERY: SHX609

## 2019-06-23 ENCOUNTER — Encounter (INDEPENDENT_AMBULATORY_CARE_PROVIDER_SITE_OTHER): Payer: Self-pay | Admitting: Family Medicine

## 2019-06-23 ENCOUNTER — Telehealth (INDEPENDENT_AMBULATORY_CARE_PROVIDER_SITE_OTHER): Payer: Self-pay | Admitting: Family Medicine

## 2019-06-23 DIAGNOSIS — E221 Hyperprolactinemia: Secondary | ICD-10-CM

## 2019-06-23 NOTE — Telephone Encounter (Signed)
Lab Order mailed to patient home address in file. Thank you

## 2019-06-23 NOTE — Telephone Encounter (Signed)
Name of Requester: Patient    Type of Order: Lab Order    Order Request: Prolactin Lab order    Patient's upcoming appointment for order: Patient wants to schedule an appointment at a lab facility closer to her house    Date of patient's last  VV:06/18/2019    Requester's preferred ph: mobile on file    Patient available on Portal: Yes    Please note: Patient called to request a copy of her lab order mail to her home address on file.  Please advise.

## 2019-07-02 ENCOUNTER — Other Ambulatory Visit (INDEPENDENT_AMBULATORY_CARE_PROVIDER_SITE_OTHER): Payer: Self-pay | Admitting: Family Medicine

## 2019-07-03 LAB — PROLACTIN: Prolactin: 8.8 ng/mL

## 2019-07-21 HISTORY — PX: COLONOSCOPY, DIAGNOSTIC (SCREENING): SHX174

## 2019-08-03 ENCOUNTER — Emergency Department
Admission: EM | Admit: 2019-08-03 | Discharge: 2019-08-03 | Disposition: A | Discharge status: Home / Self Care | Payer: No Typology Code available for payment source | Attending: Emergency Medicine | Admitting: Emergency Medicine

## 2019-08-03 DIAGNOSIS — R1013 Epigastric pain: Secondary | ICD-10-CM | POA: Insufficient documentation

## 2019-08-03 DIAGNOSIS — R112 Nausea with vomiting, unspecified: Secondary | ICD-10-CM | POA: Insufficient documentation

## 2019-08-03 LAB — MAGNESIUM: Magnesium: 2 mg/dL (ref 1.6–2.6)

## 2019-08-03 LAB — COMPREHENSIVE METABOLIC PANEL
ALT: 44 U/L (ref 0–55)
AST (SGOT): 33 U/L (ref 5–34)
Albumin/Globulin Ratio: 1.4 (ref 0.9–2.2)
Albumin: 4 g/dL (ref 3.5–5.0)
Alkaline Phosphatase: 68 U/L (ref 37–106)
Anion Gap: 11 (ref 5.0–15.0)
BUN: 16 mg/dL (ref 7.0–19.0)
Bilirubin, Total: 0.2 mg/dL (ref 0.2–1.2)
CO2: 26 mEq/L (ref 22–29)
Calcium: 8.4 mg/dL — ABNORMAL LOW (ref 8.5–10.5)
Chloride: 104 mEq/L (ref 100–111)
Creatinine: 0.7 mg/dL (ref 0.6–1.0)
Globulin: 2.9 g/dL (ref 2.0–3.6)
Glucose: 112 mg/dL — ABNORMAL HIGH (ref 70–100)
Potassium: 3.2 mEq/L — ABNORMAL LOW (ref 3.5–5.1)
Protein, Total: 6.9 g/dL (ref 6.0–8.3)
Sodium: 141 mEq/L (ref 136–145)

## 2019-08-03 LAB — CBC AND DIFFERENTIAL
Basophils Absolute Automated: 0.01 10*3/uL (ref 0.00–0.08)
Basophils Automated: 0.1 %
Eosinophils Absolute Automated: 0.03 10*3/uL (ref 0.00–0.44)
Eosinophils Automated: 0.4 %
Hematocrit: 38.4 % (ref 34.7–43.7)
Hgb: 12.7 g/dL (ref 11.4–14.8)
Lymphocytes Absolute Automated: 0.69 10*3/uL (ref 0.42–3.22)
Lymphocytes Automated: 8.8 %
MCH: 31.1 pg (ref 25.1–33.5)
MCHC: 33.1 g/dL (ref 31.5–35.8)
MCV: 93.9 fL (ref 78.0–96.0)
MPV: 9.9 fL (ref 8.9–12.5)
Monocytes Absolute Automated: 0.3 10*3/uL (ref 0.21–0.85)
Monocytes: 3.8 %
Neutrophils Absolute: 6.83 10*3/uL — ABNORMAL HIGH (ref 1.10–6.33)
Neutrophils: 86.9 %
Platelets: 214 10*3/uL (ref 142–346)
RBC: 4.09 10*6/uL (ref 3.90–5.10)
RDW: 13 % (ref 11–15)
WBC: 7.86 10*3/uL (ref 3.10–9.50)

## 2019-08-03 LAB — URINALYSIS REFLEX TO MICROSCOPIC EXAM - REFLEX TO CULTURE
Bilirubin, UA: NEGATIVE
Glucose, UA: NEGATIVE
Ketones UA: NEGATIVE
Nitrite, UA: NEGATIVE
Specific Gravity UA: 1.015 (ref 1.001–1.035)
Urine pH: 8.5 — AB (ref 5.0–8.0)
Urobilinogen, UA: 0.2 mg/dL (ref 0.2–2.0)

## 2019-08-03 LAB — URINE HCG QUALITATIVE: Urine HCG Qualitative: NEGATIVE

## 2019-08-03 LAB — LIPASE: Lipase: 34 U/L (ref 8–78)

## 2019-08-03 LAB — GFR: EGFR: >60

## 2019-08-03 MED ORDER — SODIUM CHLORIDE 0.9 % IV BOLUS
1000.00 mL | Freq: Once | INTRAVENOUS | Status: AC
Start: 2019-08-03 — End: 2019-08-03
  Administered 2019-08-03: 1000 mL via INTRAVENOUS

## 2019-08-03 MED ORDER — ONDANSETRON HCL 4 MG/2ML IJ SOLN
4.00 mg | Freq: Once | INTRAMUSCULAR | Status: AC
Start: 2019-08-03 — End: 2019-08-03
  Administered 2019-08-03: 4 mg via INTRAVENOUS
  Filled 2019-08-03: qty 2

## 2019-08-03 MED ORDER — FAMOTIDINE 10 MG/ML IV SOLN (WRAP)
20.00 mg | Freq: Once | INTRAVENOUS | Status: AC
Start: 2019-08-03 — End: 2019-08-03
  Administered 2019-08-03: 20 mg via INTRAVENOUS
  Filled 2019-08-03: qty 2

## 2019-08-03 MED ORDER — ONDANSETRON 4 MG PO TBDP
4.00 mg | ORAL_TABLET | Freq: Three times a day (TID) | ORAL | 0 refills | Status: AC | PRN
Start: 2019-08-03 — End: 2019-08-15

## 2019-08-03 MED ORDER — POTASSIUM CHLORIDE CRYS ER 20 MEQ PO TBCR
40.00 meq | EXTENDED_RELEASE_TABLET | Freq: Once | ORAL | Status: AC
Start: 2019-08-03 — End: 2019-08-03
  Administered 2019-08-03: 40 meq via ORAL
  Filled 2019-08-03: qty 2

## 2019-08-03 NOTE — ED Notes (Signed)
Report received from [facility]. Pt care assumed. Pt waiting for IVF to complete and then discharge per MD Gibson Ramp

## 2019-08-03 NOTE — ED Notes (Signed)
ED SUBSEQUENT CARE NOTE (Care after ED handoff)      Patient received in bedside sign out from Dr. Gibson Ramp, Collier Flowers, MD approximately 7:27 PM    Patient awaiting: Labs/Reeval      Clinical Course:      Patient labs all reassuring.  K+ 3.2 so given PO potassium.  Pt reports feeling improved; pain resolved; still slight nausea.  Will give additional 1L NS and 4 more IV zofran.  After reeval; sx completely resolved; tolerating PO.  Stable for [place] and f/u PCP as needed.  Given rx for zofran as needed.  If develops worsening pain; instructed to return to ED for reeval.        Final Diagnosis and Disposition:      Final diagnoses:   Epigastric abdominal pain   Non-intractable vomiting with nausea, unspecified vomiting type       ED Disposition     None            Scribe Attestation:      No scribe involved in the care of this patient            Virl Cagey, MD  08/03/19 Serena Croissant

## 2019-08-03 NOTE — ED Provider Notes (Signed)
[facility] EMERGENCY DEPARTMENT H&P         CLINICAL SUMMARY          Diagnosis:    .     Final diagnoses:   Epigastric abdominal pain   Non-intractable vomiting with nausea, unspecified vomiting type         MDM Notes:            Disposition:           Patient signed out in bedside rounds to Dr. Gibson Ramp at 5:56 PM.      Final ED disposition (if available):      ED Disposition     None                    CLINICAL INFORMATION        HPI:      Chief Complaint: Abdominal Pain  .    Leah Morgan is a 44 y.o. female who presents with complaint of diffuse abdominal pain which began around 12 noon today.  Patient states that she drank drink quite a bit last night and feels as if she was hung over.  She says earlier at around 3 AM because of that feeling she took some Tylenol and Alka-Seltzer.  She states she continued to feel nauseated off and on throughout the day.  She states that they were traveling back from [place] to her home which is here in [place].  But then around 12 noon she started getting the pain and she also had couple episodes of vomiting at that time.  At this present time she complains of diffuse pain as well as persistent nausea.  She denies any fever or chills.  Denies any earache or sore throat.  Denies any chest pain or shortness of breath.  Denies any urinary complaints.  Patient states she is presently on her menstrual cycle , and even though she sometimes gets cramps with her cycles she states that this pain is different from her menstrual cramps.    History obtained from: Patient          ROS:      Review of Systems   Constitutional: Negative for chills and fever.   HENT: Negative for congestion, ear pain and sore throat.    Eyes: Negative for blurred vision, double vision, pain and redness.   Respiratory: Negative for cough and shortness of breath.    Cardiovascular: Negative for chest pain, palpitations and leg swelling.   Gastrointestinal: Positive for  abdominal pain, nausea and vomiting. Negative for blood in stool, constipation and diarrhea.   Genitourinary: Negative for dysuria, flank pain and urgency.   Musculoskeletal: Negative for back pain and neck pain.   Neurological: Negative for dizziness, tingling, focal weakness and headaches.   All other systems reviewed and are negative.          Physical Exam:      Pulse 62  BP 121/65  Resp (!) 24  SpO2 100 %  Temp 98.5 F (36.9 C)    Physical Exam   Constitutional: She is oriented to person, place, and time. She appears well-developed and well-nourished.   HENT:   Head: Normocephalic and atraumatic.   Right Ear: External ear normal.   Left Ear: External ear normal.   Mouth/Throat: Oropharynx is clear and moist. No oropharyngeal exudate.   Eyes: Pupils are equal, round, and reactive to light. Conjunctivae and EOM are normal.   Neck: Normal range of motion. Neck supple.  Cardiovascular: Normal rate, regular rhythm, normal heart sounds and intact distal pulses.   Pulmonary/Chest: Effort normal and breath sounds normal. No respiratory distress. She has no wheezes. She exhibits no tenderness.   Abdominal: Soft. Bowel sounds are normal. She exhibits no distension. There is abdominal tenderness.   Mild midepigastric tenderness noted.  No rebound tenderness noted.  No evidence of an acute abdomen is noted.   Musculoskeletal: Normal range of motion.   Lymphadenopathy:     She has no cervical adenopathy.   Neurological: She is alert and oriented to person, place, and time. She has normal strength and normal reflexes. Coordination normal. GCS eye subscore is 4. GCS verbal subscore is 5. GCS motor subscore is 6.   Skin: Skin is warm and dry. No rash noted.   Psychiatric: She has a normal mood and affect. Her behavior is normal.   Nursing note and vitals reviewed.                 PAST HISTORY        Primary Care Provider: Jeanette Caprice, MD        PMH/PSH:    .     Past Medical History:   Diagnosis Date   . Seasonal  allergic rhinitis        She has a past surgical history that includes Foot surgery; Dental surgery; and RECONSTRUCTION, SEPTAL.      Social/Family History:      She reports that she has never smoked. She has never used smokeless tobacco. She reports current alcohol use. She reports that she does not use drugs.    Family History   Problem Relation Age of Onset   . Hypertension Mother    . Hypertension Father    . Hyperlipidemia Father    . Heart disease Father    . Kidney disease Father    . Diabetes Maternal Grandmother    . Cancer Maternal Grandmother    . Glaucoma Maternal Grandmother    . Diabetes Maternal Grandfather    . Cancer Maternal Grandfather    . Diabetes Paternal Grandmother    . Kidney disease Brother    . Cancer Paternal Aunt          Listed Medications on Arrival:    .     Previous Medications    ASCORBIC ACID (VITAMIN C) 100 MG TABLET    Take 100 mg by mouth daily    CALCIUM CARBONATE ANTACID (ALKA-SELTZER ANTACID PO)    Take by mouth    HYDROCORTISONE (ANUSOL-HC) 2.5 % RECTAL CREAM    Place rectally 2 (two) times daily    IBUPROFEN (ADVIL) 200 MG TABLET    Take 200 mg by mouth every 6 (six) hours as needed for Pain    PRENATAL VIT-FE FUMARATE-FA (PRENATAL VITAMIN PO)    Take 1 tablet by mouth      Allergies: She has No Known Allergies.            VISIT INFORMATION        Clinical Course in the ED:             Medications Given in the ED:    .     ED Medication Orders (From admission, onward)    Start Ordered     Status Ordering Provider    08/03/19 1706 08/03/19 1705  famotidine (PEPCID) injection 20 mg  Once     Route: Intravenous  Ordered Dose: 20 mg  Last MAR action: Given Kanav Kazmierczak L    08/03/19 1706 08/03/19 1705  ondansetron (ZOFRAN) injection 4 mg  Once     Route: Intravenous  Ordered Dose: 4 mg     Last MAR action: Given Hareem Surowiec L            Procedures:      Procedures      Interpretations:      O2 sat-           saturation: 100 %; Oxygen use: room air; Interpretation: Normal     DDx-              Initial differential diagnosis included: pancreatitis cholecystitis/cholelithiasis peptic ulcer disease                     RESULTS        Lab Results:      Results     Procedure Component Value Units Date/Time    CBC and differential [[id number]]  (Abnormal) Collected: 08/03/19 1735    Specimen: Blood Updated: 08/03/19 1742     WBC 7.86 x10 3/uL      Hgb 12.7 g/dL      Hematocrit 84.1 %      Platelets 214 x10 3/uL      RBC 4.09 x10 6/uL      MCV 93.9 fL      MCH 31.1 pg      MCHC 33.1 g/dL      RDW 13 %      MPV 9.9 fL      Neutrophils 86.9 %      Lymphocytes Automated 8.8 %      Monocytes 3.8 %      Eosinophils Automated 0.4 %      Basophils Automated 0.1 %      Immature Granulocytes Unmeasured %      Nucleated RBC Unmeasured /100 WBC      Neutrophils Absolute 6.83 x10 3/uL      Lymphocytes Absolute Automated 0.69 x10 3/uL      Monocytes Absolute Automated 0.30 x10 3/uL      Eosinophils Absolute Automated 0.03 x10 3/uL      Basophils Absolute Automated 0.01 x10 3/uL      Immature Granulocytes Absolute Unmeasured x10 3/uL      Absolute NRBC Unmeasured x10 3/uL     Narrative:      Diffuse non-descriptive abd pain  Replace urinary catheter prior to obtaining the urine culture  if it has been in place for greater than or equal to 14  days:->N/A No Foley  Indications for U/A Reflex to Micro - Reflex to  Culture:->Other (please specify in Comments)    Magnesium [[id number]] Collected: 08/03/19 1735    Specimen: Blood Updated: 08/03/19 1740    Narrative:      Diffuse non-descriptive abd pain  Replace urinary catheter prior to obtaining the urine culture  if it has been in place for greater than or equal to 14  days:->N/A No Foley  Indications for U/A Reflex to Micro - Reflex to  Culture:->Other (please specify in Comments)    Comprehensive metabolic panel [[id number]] Collected: 08/03/19 1735    Specimen: Blood Updated: 08/03/19 1740    Narrative:      Diffuse non-descriptive abd pain  Replace urinary  catheter prior to obtaining the urine culture  if it has been in place for greater than or equal to 14  days:->N/A No Foley  Indications for U/A  Reflex to Micro - Reflex to  Culture:->Other (please specify in Comments)    Lipase [[id number]] Collected: 08/03/19 1735    Specimen: Blood Updated: 08/03/19 1740    Narrative:      Diffuse non-descriptive abd pain  Replace urinary catheter prior to obtaining the urine culture  if it has been in place for greater than or equal to 14  days:->N/A No Foley  Indications for U/A Reflex to Micro - Reflex to  Culture:->Other (please specify in Comments)    Urinalysis Reflex to Microscopic Exam- Reflex to Culture [[id number]]  (Abnormal) Collected: 08/03/19 1705     Updated: 08/03/19 1718     Urine Type Urine, Clean Ca     Color, UA Yellow     Clarity, UA Hazy     Specific Gravity UA 1.015     Urine pH 8.5     Leukocyte Esterase, UA Trace     Nitrite, UA Negative     Protein, UR Trace     Glucose, UA Negative     Ketones UA Negative     Urobilinogen, UA 0.2 mg/dL      Bilirubin, UA Negative     Blood, UA Large     RBC, UA TNTC /hpf      WBC, UA 0 - 5 /hpf      Squamous Epithelial Cells, Urine 0 - 5 /hpf      Urine Amorphous Many /hpf     Narrative:      Diffuse non-descriptive abd pain  Replace urinary catheter prior to obtaining the urine culture  if it has been in place for greater than or equal to 14  days:->N/A No Foley  Indications for U/A Reflex to Micro - Reflex to  Culture:->Other (please specify in Comments)    Urine HCG Qualitative [[id number]] Collected: 08/03/19 1705    Specimen: Urine Updated: 08/03/19 1712     Urine HCG Qualitative Negative    Narrative:      Diffuse non-descriptive abd pain  Replace urinary catheter prior to obtaining the urine culture  if it has been in place for greater than or equal to 14  days:->N/A No Foley  Indications for U/A Reflex to Micro - Reflex to  Culture:->Other (please specify in Comments)              Radiology Results:      No orders  to display               Scribe Attestation:      No scribe involved in the care of this patient          Philippa Sicks, MD  08/14/19 [phone]

## 2019-08-03 NOTE — Discharge Instructions (Signed)
Vomiting    You have been seen for vomiting.    Vomiting (throwing-up) can be caused by many different things. Most of the time the cause IS NOT serious. The doctor feels it is OK for you to go home today.    Common causes of vomiting include the following:   Gastroenteritis (stomach flu), usually with diarrhea.   Other illnesses. Sometimes medical conditions like diabetes, heart problems, headaches, or infections can make someone throw up.    Bowel obstructions (blockages) can cause vomiting and make patients unable to have bowel movements (stool) or pass gas.   Vomiting can be a symptom of appendicitis, especially if there is also pain in the right lower abdomen (belly).    Sometimes it is hard to find out what is causing the vomiting. Vomiting can be treated with anti-nausea medicines like promethazine (Phenergan), prochlorperazine (Compazine) or ondansetron (Zofran).    Try to drink liquids to avoid dehydration. Don't drink a lot of fluid all at once. Take small sips throughout the day.    YOU SHOULD SEEK MEDICAL ATTENTION IMMEDIATELY, EITHER HERE OR AT THE NEAREST EMERGENCY DEPARTMENT, IF ANY OF THE FOLLOWING OCCURS:   You can't stop vomiting or your vomiting doesn't get better with medication.   You cannot keep liquids down.   You have severe sudden chest or belly pain after vomiting.   You have abdominal pain.     Dear Mrs. Mchugh:    Thank you for choosing one of [facility] emergency departments.  I hope your visit today was EXCELLENT.    Specific instructions for your visit today:      IF YOU DO NOT CONTINUE TO IMPROVE OR YOUR CONDITION WORSENS, PLEASE CONTACT YOUR DOCTOR OR RETURN IMMEDIATELY TO THE EMERGENCY DEPARTMENT.    Sincerely,  Virl Cagey, MD  Attending Emergency Physician  [facility] Emergency Department    OBTAINING A PRIMARY CARE APPOINTMENT    Primary care physicians (PCPs, also known as primary care doctors) are either internists  or family medicine doctors. Both types of PCPs focus on health promotion, disease prevention, patient education and counseling, and treatment of acute and chronic medical conditions.    Call for an appointment with a primary care doctor.  Ask to see who is taking new patients.     [facility]  telephone:  [phone]  [email or web address]    For a pediatrician, call the [facility] referral line below.  You can also call to make an appointment at [facility] (except [employer] and [facility]):    [address]  [place], [place] [address]  [phone]    [facility]  Call [phone] (available 24 hours a day, 7 days a week) if you need any further referrals and we can help you find a primary care doctor or specialist.  Also, available online at:  [email or web address]    For more information regarding our services at [facility], please call the number above or visit the website [email or web address]    YOUR CONTACT INFORMATION  Before leaving please check with registration to make sure we have an up-to-date contact number.  You can call registration at [phone], Option 7 [facility] location) or [phone], Option 1 [facility] location) to update your information.  For questions about your hospital bill, please call [phone].  For questions about your Emergency Dept Physician bill please call [phone].      FREE HEALTH SERVICES  If you need  help with health or social services, please call 2-1-1 for a free referral to resources in your area.  2-1-1 is a free service connecting people with information on health insurance, free clinics, pregnancy, mental health, dental care, food assistance, housing, and substance abuse counseling.  Also, available online at:  http://www.[facility].org    MEDICAL RECORDS AND TESTS  Certain laboratory test results do not come back the same day, for example urine cultures.   We will  contact you if other important findings are noted.  Radiology films are often reviewed again to ensure accuracy.  If there is any discrepancy, we will notify you.      Please call [phone] [facility] location) or [phone] ([place] location) to pick up a complimentary CD of any radiology studies performed.  If you or your doctor would like to request a copy of your medical records, please call [phone].      ORTHOPEDIC INJURY   Please know that significant injuries can exist even when an initial x-ray is read as normal or negative.  This can occur because some fractures (broken bones) are not initially visible on x-rays.  For this reason, close outpatient follow-up with your primary care doctor or bone specialist (orthopedist) is required.    MEDICATIONS AND FOLLOWUP  Please be aware that some prescription medications can cause drowsiness.  Use caution when driving or operating machinery.    The examination and treatment you have received in our Emergency Department is provided on an emergency basis, and is not intended to be a substitute for your primary care physician.  It is important that your doctor checks you again and that you report any new or remaining problems at that time.      24 HOUR PHARMACIES  Two nearby 24 hour pharmacies are:    [facility]  [address]  [place], [place] [address]  [phone]    [facility]  [address]  [place], [place] [address]  [phone]      ASSISTANCE WITH INSURANCE    Affordable Care Act  [facility])  Call to start or finish an application, compare plans, enroll or ask a question.  [phone]  TTY: [phone]  Web:  Healthcare.gov    Help Enrolling in [facility]  [employer] [place]  (613)366-7044 (TOLL-FREE)  (631) 809-2940 (TTY)  Web:  Http://www.coverva.org    Local Help Enrolling in the [facility] Series  [place] [facility]  (406)763-5060 (MAIN)  Email:  health-help@nvfs .org  Web:  BlackjackMyths.is  Address:  [address] [place], [place] [address]    SEDATING MEDICATIONS  Sedating medications include strong pain medications (e.g. narcotics), muscle relaxers, benzodiazepines (used for anxiety and as muscle relaxers), Benadryl/diphenhydramine and other antihistamines for allergic reactions/itching, and other medications.  If you are unsure if you have received a sedating medication, please ask your physician or nurse.  If you received a sedating medication: DO NOT drive a car. DO NOT operate machinery. DO NOT perform jobs where you need to be alert.  DO NOT drink alcoholic beverages while taking this medicine.     If you get dizzy, sit or lie down at the first signs. Be careful going up and down stairs.  Be extra careful to prevent falls.     Never give this medicine to others.     Keep this medicine out of reach of children.     Do not take or save old medicines. Throw them away when outdated.  Keep all medicines in a cool, dry place. DO NOT keep them in your bathroom medicine cabinet or in a cabinet above the stove.    MEDICATION REFILLS  Please be aware that we cannot refill any prescriptions through the ER. If you need further treatment from what is provided at your ER visit, please follow up with your primary care doctor or your pain management specialist.

## 2019-08-10 ENCOUNTER — Ambulatory Visit (INDEPENDENT_AMBULATORY_CARE_PROVIDER_SITE_OTHER): Payer: No Typology Code available for payment source

## 2019-08-10 DIAGNOSIS — Z20822 Contact with and (suspected) exposure to covid-19: Secondary | ICD-10-CM

## 2019-08-10 DIAGNOSIS — Z01818 Encounter for other preprocedural examination: Secondary | ICD-10-CM

## 2019-08-11 LAB — SARS-COV-2 (COVID-19) RNA, PCR: SARS-CoV-2 Overall Result: NOT DETECTED

## 2019-08-11 LAB — COVID-19 (SARS-COV-2)

## 2019-08-12 ENCOUNTER — Other Ambulatory Visit: Payer: Self-pay | Admitting: Physician Assistant

## 2019-09-25 ENCOUNTER — Encounter (INDEPENDENT_AMBULATORY_CARE_PROVIDER_SITE_OTHER): Payer: Self-pay

## 2019-09-26 ENCOUNTER — Encounter (INDEPENDENT_AMBULATORY_CARE_PROVIDER_SITE_OTHER): Payer: Self-pay

## 2019-10-14 ENCOUNTER — Other Ambulatory Visit (INDEPENDENT_AMBULATORY_CARE_PROVIDER_SITE_OTHER): Payer: Self-pay | Admitting: Family Medicine

## 2019-10-25 ENCOUNTER — Encounter (INDEPENDENT_AMBULATORY_CARE_PROVIDER_SITE_OTHER): Payer: Self-pay

## 2019-10-26 ENCOUNTER — Encounter (INDEPENDENT_AMBULATORY_CARE_PROVIDER_SITE_OTHER): Payer: Self-pay

## 2019-11-25 ENCOUNTER — Encounter (INDEPENDENT_AMBULATORY_CARE_PROVIDER_SITE_OTHER): Payer: Self-pay

## 2019-11-26 ENCOUNTER — Encounter (INDEPENDENT_AMBULATORY_CARE_PROVIDER_SITE_OTHER): Payer: Self-pay

## 2019-12-25 ENCOUNTER — Encounter (INDEPENDENT_AMBULATORY_CARE_PROVIDER_SITE_OTHER): Payer: Self-pay

## 2019-12-26 ENCOUNTER — Encounter (INDEPENDENT_AMBULATORY_CARE_PROVIDER_SITE_OTHER): Payer: Self-pay

## 2020-01-01 ENCOUNTER — Encounter (INDEPENDENT_AMBULATORY_CARE_PROVIDER_SITE_OTHER): Payer: Self-pay | Admitting: Family Medicine

## 2020-01-02 NOTE — Progress Notes (Signed)
Patient needs ov to check vaccines, titers, possible TB testing. Please help schedule

## 2020-01-05 NOTE — Progress Notes (Addendum)
RN lmtcb @202 -[id number]. (disclosured signed 04/13/20 for mobile number). RN verbalized RE provider's annotations and RN stated for pt to check [facility] messages.

## 2020-01-22 ENCOUNTER — Encounter (INDEPENDENT_AMBULATORY_CARE_PROVIDER_SITE_OTHER): Payer: Self-pay

## 2020-01-25 ENCOUNTER — Encounter (INDEPENDENT_AMBULATORY_CARE_PROVIDER_SITE_OTHER): Payer: Self-pay

## 2020-01-26 ENCOUNTER — Encounter (INDEPENDENT_AMBULATORY_CARE_PROVIDER_SITE_OTHER): Payer: Self-pay

## 2020-02-10 IMAGING — US US Abdomen LTD
1 series · 13 of 16 positions shown · non-contrast
Comparison: CT 12/24/2018.

US Abdomen LTD
INDICATION: Right upper quadrant pain.                                                   
 Pertinent History: RUQ pain x1 month.
TECHNIQUE: Grayscale ultrasound evaluation of the right upper abdomen.                    
 Technologist Comments: None.                                                              
 Limitations: None.

[Series 1: us abdomen ltd · 13 of 63 slices shown]
[im 1/63]
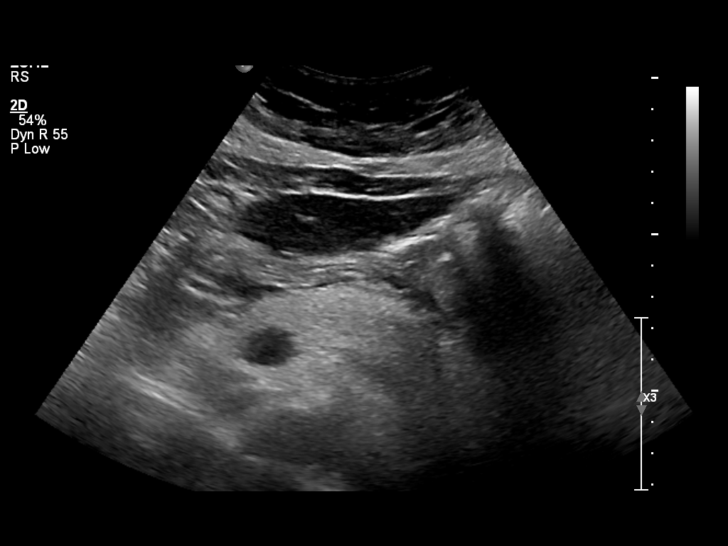
[im 5/63]
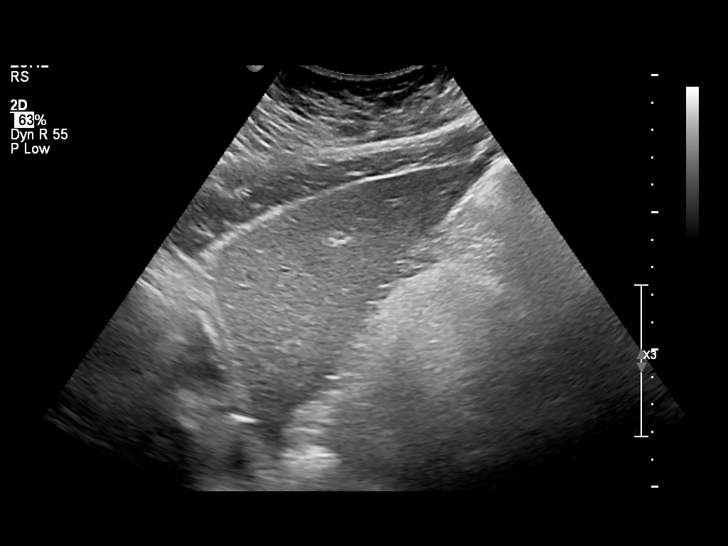
[im 13/63]
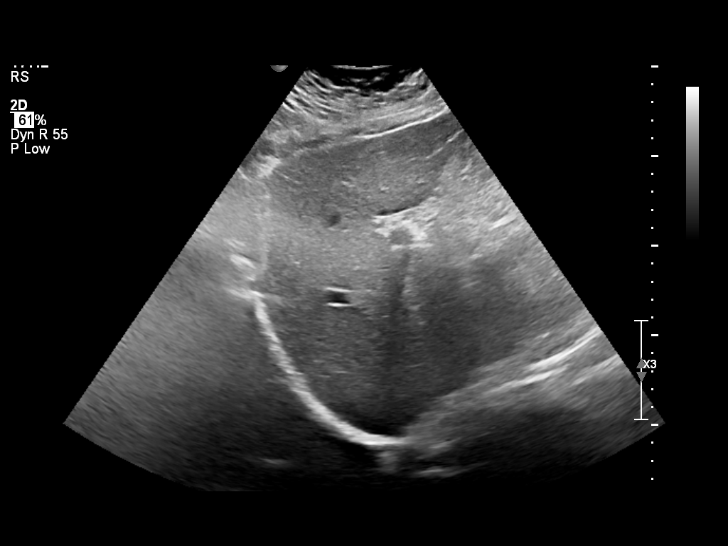
[im 17/63]
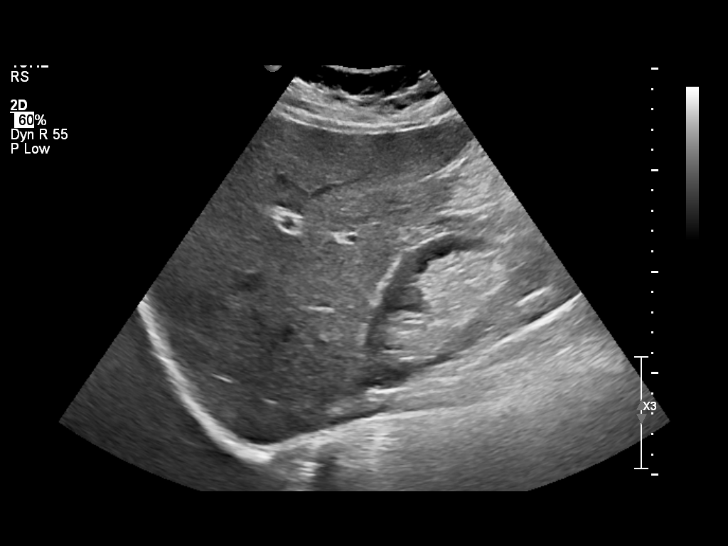
[im 21/63]
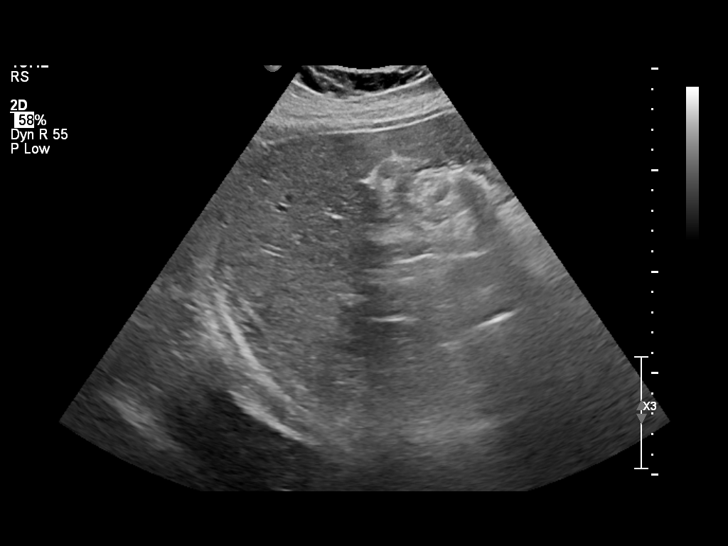
[im 25/63]
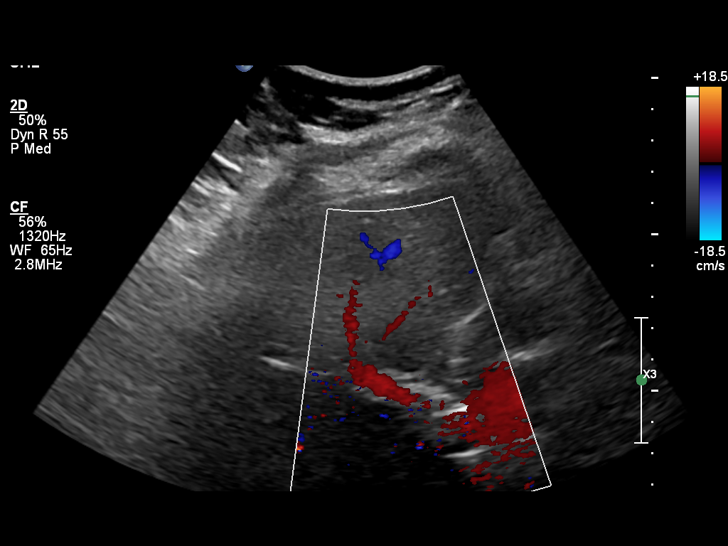
[im 34/63]
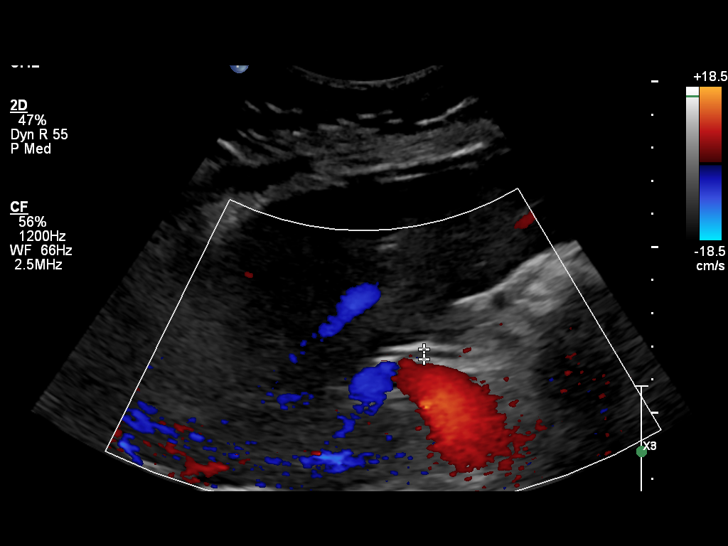
[im 38/63]
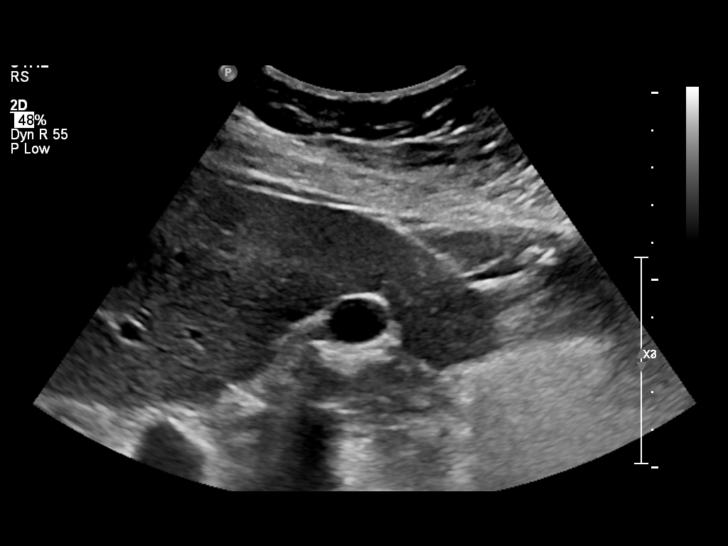
[im 42/63]
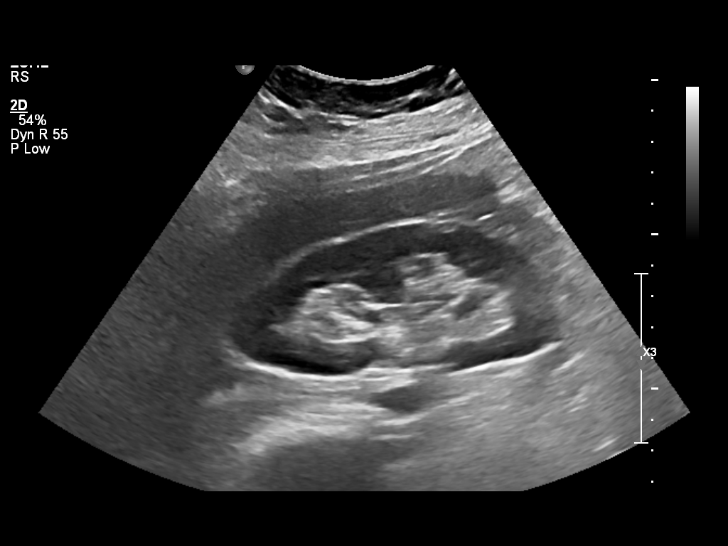
[im 46/63]
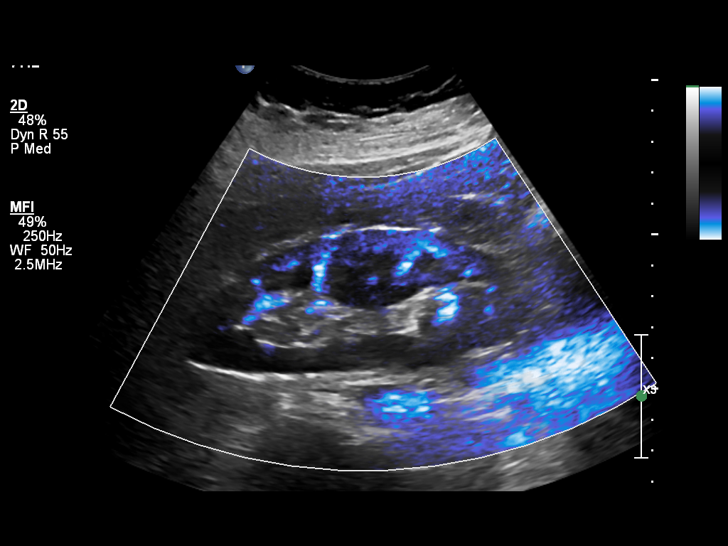
[im 50/63]
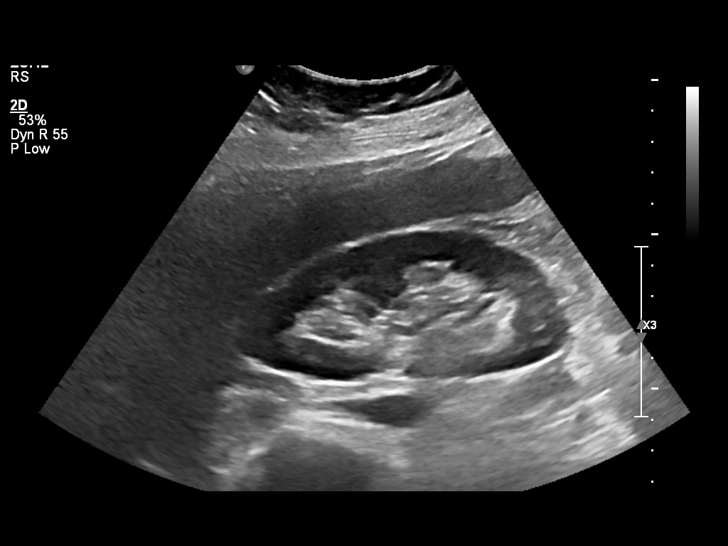
[im 58/63]
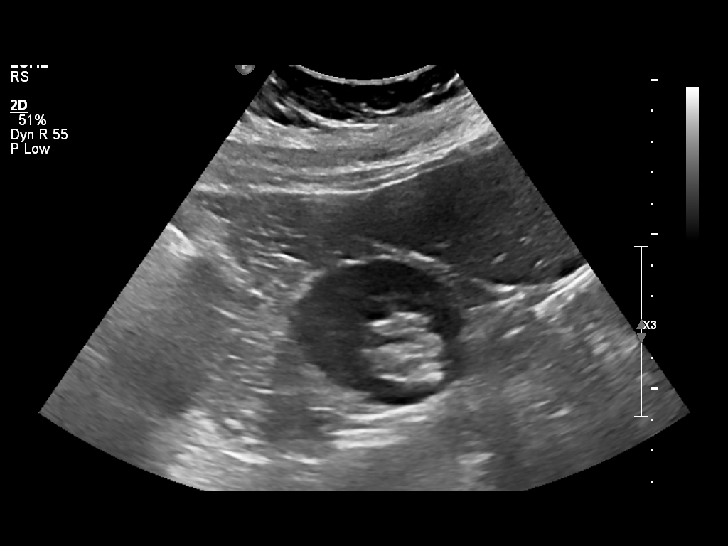
[im 63/63]
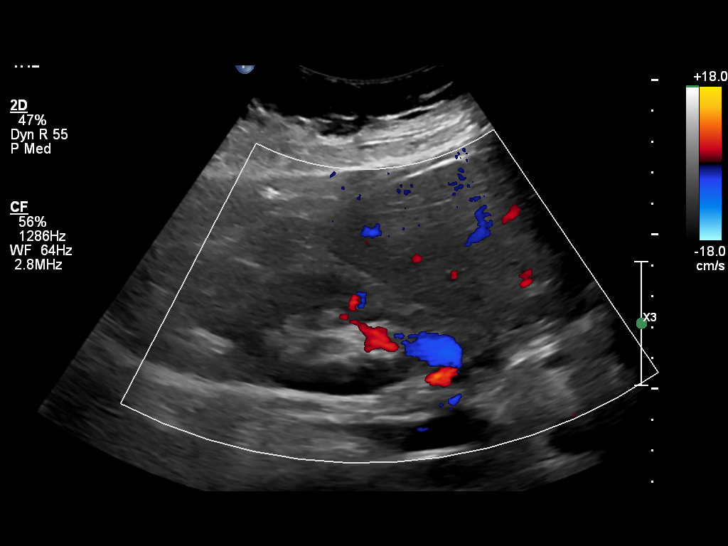

[13 of 16 positions shown; findings below may reference images not displayed]

FINDINGS: PANCREAS                                                                                  
 Normal as visualized.                                                                     
 LIVER                                                                                     
 Parenchyma: Normal in echogenicity and contour.                                           
 Lesions: None.                                                                            
 Portal vein: Antegrade flow.                                                              
 BILE DUCTS                                                                                
 Common/extrahepatic bile ducts: 3 mm.                                                     
 Intrahepatic bile ducts:  Not dilated.                                                    
 GALLBLADDER-The gallbladder appears slightly contracted                                   
 Wall: Normal.                                                                             
 Lumen: No cholelithiasis.                                                                 
 Pericholecystic fluid: Absent.                                                            
 Murphy's sign: Negative.                                                                  
 RIGHT KIDNEY                                                                              
 Size: 10.7 x 4.6 x 5.4 cm.                                                                
 Cortex: Normal echogenicity. Normal thickness.                                            
 Lesions: None.                                                                            
 Calculi: None identified.                                                                 
 Hydronephrosis: None.                                                                     
 Free fluid: None identified.                                                              
 Additional findings: None.
IMPRESSION: 1.  The gallbladder appears slightly contracted, the examination is                       
 otherwise                                                                                 
 unremarkable.

## 2020-02-25 ENCOUNTER — Ambulatory Visit (INDEPENDENT_AMBULATORY_CARE_PROVIDER_SITE_OTHER): Payer: No Typology Code available for payment source | Admitting: Family Medicine

## 2020-02-25 ENCOUNTER — Encounter (INDEPENDENT_AMBULATORY_CARE_PROVIDER_SITE_OTHER): Payer: Self-pay | Admitting: Family Medicine

## 2020-02-25 ENCOUNTER — Encounter (INDEPENDENT_AMBULATORY_CARE_PROVIDER_SITE_OTHER): Payer: Self-pay

## 2020-02-25 ENCOUNTER — Encounter (INDEPENDENT_AMBULATORY_CARE_PROVIDER_SITE_OTHER): Payer: No Typology Code available for payment source | Admitting: Family

## 2020-02-25 VITALS — BP 100/80 | HR 88 | Temp 96.4°F | Resp 12 | Ht 58.5 in | Wt 115.6 lb

## 2020-02-25 DIAGNOSIS — Z0000 Encounter for general adult medical examination without abnormal findings: Secondary | ICD-10-CM

## 2020-02-25 DIAGNOSIS — R5383 Other fatigue: Secondary | ICD-10-CM

## 2020-02-25 DIAGNOSIS — R0789 Other chest pain: Secondary | ICD-10-CM

## 2020-02-25 DIAGNOSIS — Z23 Encounter for immunization: Secondary | ICD-10-CM

## 2020-02-25 DIAGNOSIS — Z13220 Encounter for screening for lipoid disorders: Secondary | ICD-10-CM

## 2020-02-25 DIAGNOSIS — Z131 Encounter for screening for diabetes mellitus: Secondary | ICD-10-CM

## 2020-02-25 NOTE — Patient Instructions (Addendum)
Good exam today and vital signs    I personally counselled patient and/or parent about the importance of vaccines and the risk/benefit of this administration today flu shot  - Nurse also with review and does the administration    Multiple issues brought to my attention today    No concerns on this exam today    Need to do chest xray and given order now    Need to see cardiology for check on "chest pain"    Here are names in the area for cardiologist to call and be seen for chest pain    [facility] ---> MANY locations and please look at [email or web address]  - online scheduling available    [facility] - [facility] - [facility] - Einar Gip  [facility] - [facility] - [facility] Cardiology - [facility] - [facility] Cardiology - [facility] - [facility] Cardiology - [place]    OR      [facility]   [phone]  [email or web address]    OR    [facility]   [phone]  [email or web address]        Need to contact dermatology to do skin check     [facility]  [facility]  [address],  [place], [place] [address]  [phone]    [facility]  Dr. Wyman Songster  [address]  [place], [place] [address] [phone]  [email or web address]    [facility]  Dr Florentina Addison  [address]  [place], [place] [address] [phone]  [email or web address]    [facility]  Dermatology  [address], [place], [place] [address]  Ph. [phone], Fax [phone]    [facility] [place]  Dr. Evette Cristal [address]  [place], [place] [address] [phone]  [email or web address]      [facility] [place]  Dr. Waymond Cera  [address] [place], [place] [address] [phone]  [email or web address]    [facility]  Dr. 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 Allegra Grana Dr. Rene Kocher, OD  [address]  [place], [place] [address] [phone] [email or web address]    Dr. Clementeen Graham  [address]  [place], [place] [address] [phone]    Dr. Gerrit Halls  [address]   [place], [place] [address] [phone]    Dr Tana Conch  [facility]    [address]  [place], [place] [address]  Phone: [phone]  Fax: [phone]    [facility]  [address]  [place], [place] [address]  [phone]  [email or web address]    Left heel bump no concerns on exam    Please remember to fill our OFFICE SURVEY after your visit to let us know how we are doing.  Thank you !!!    Make sure to do FLU SHOT after 18 Feb 2020 and if not here in the office recommend [facility], [facility], or any local grocery        FOR SCHEDULING to AVOID  calling the office     PLEASE USE MyChart    You can always schedule 15 minute OFFICE or VIDEO VISITS online  through MyChart on a daily basis     REMEMBER you can ONLY do video visits if you are in the state of [place] with our providers.  You cannot be in [place] and do a video visit with a provider for example.    To make an office visit or  video visit just go to "Visits" at the top and click on "Schedule an appointment" then click on a provider then reason for visit is "office visit" or "video visit" and then pick date and time and self schedule    REMEMBER for Video Visits    Our office will send you a link to to log into the [email or web address] video visit website so please wait for this and click on this on  your email or cell phone  - DO NOT log into the MyChart video visit as our office currently DOES NOT use this product    ALSO    Send a message to our staff to schedule IN OFFICE physical or preop check     AND     You can write a message requesting a physical/office visit through MyChart message provider  - Recommend if you are requesting an in office visit or physical to write the dates you are available and if you want to be seen in the AM or PM so we can  expedite the process of scheduling for you.  You can write a message requesting a physical/office visit through MyChart message provider    Also     You can google "[facility]"  and click on "Book an appointment"    Find a provider and time that fits your needs do to visit    If video visit then schedule online    If needing office visit or physical then will have to call the office at [phone] so we can check your benefits and make sure your provider is in the office      Discussion of medicines, use, and side effects as applicable  Importance of avoiding tobacco, excessive alcohol, ilicit drug use, high risk sexual behavior and sun exposure is reviewed.  Seat belt use is encouraged.  If own firearm, make sure you have taken firearm classes. Keep unloaded and safe storage is imperative  Importance of regular moderate exercise and low fat high fiber diet that includes 5 servings of fruits and vegetables a day is discussed.    Let us know if any issues

## 2020-02-25 NOTE — Progress Notes (Signed)
Subjective:      Patient ID: Leah Morgan is a 44 y.o. female     Chief Complaint   Patient presents with   . Annual Exam        Meds none  Early CAD or CA in First degree family member none  Allergies nkda  Tobacco none  Alcohol some  Exercise none  Drug use none  Concern for high risk sexual behavior or STD check none  Urine issues none  Colon cancer screening --> n/a    Dental 2021  Vision last 2 years  Dematology none    For Women:    Pap 2021  Mammogram 2020 Dec  Dexa n/a    PHQ-2 is neg    Other issues that are additional to physical:  - several medical issues  - chest pain at times and not today --> no sob or leg swelling and no cough  - skin changes on face  - pterygium left eye  - bump near left heel  - fatigue at night       The following sections were reviewed this encounter by the provider:        Review of Systems   Constitutional: Positive for fatigue and unexpected weight change. Negative for activity change, appetite change and fever.   HENT: Negative for congestion, dental problem, hearing loss, postnasal drip, sore throat, tinnitus and trouble swallowing.    Eyes: Negative for photophobia, pain, discharge, redness, itching and visual disturbance.   Respiratory: Negative for apnea, cough, choking, shortness of breath, wheezing and stridor.    Cardiovascular: Positive for chest pain (not today). Negative for palpitations and leg swelling.   Gastrointestinal: Negative for abdominal pain, blood in stool, diarrhea, nausea and vomiting.   Endocrine: Negative for polydipsia, polyphagia and polyuria.   Genitourinary: Negative for decreased urine volume, difficulty urinating, dysuria, enuresis, frequency, hematuria and urgency.   Musculoskeletal: Negative for arthralgias and gait problem.   Skin: Negative for rash.   Allergic/Immunologic: Negative for environmental allergies and food allergies.   Neurological: Negative for dizziness, tremors, syncope, weakness, light-headedness, numbness and  headaches.   Hematological: Negative for adenopathy. Does not bruise/bleed easily.   Psychiatric/Behavioral: Negative for dysphoric mood, self-injury, sleep disturbance and suicidal ideas. The patient is not nervous/anxious.           BP 100/80 (BP Site: Right arm, Patient Position: Sitting, Cuff Size: Medium)   Pulse 88   Temp (!) 96.4 F (35.8 C) (Tympanic)   Resp 12   Ht 1.486 m (4' 10.5")   Wt 52.4 kg (115 lb 9.6 oz)   LMP 02/13/2020   BMI 23.75 kg/m     Objective:     Physical Exam  Vitals and nursing note reviewed.   Constitutional:       Appearance: Normal appearance. She is normal weight.   HENT:      Head: Normocephalic and atraumatic.      Right Ear: Tympanic membrane, ear canal and external ear normal.      Left Ear: Tympanic membrane, ear canal and external ear normal.      Nose: Nose normal.      Mouth/Throat:      Mouth: Mucous membranes are moist.      Pharynx: Oropharynx is clear.   Eyes:      General: No scleral icterus.        Right eye: No discharge.         Left eye: No discharge.  Extraocular Movements: Extraocular movements intact.      Conjunctiva/sclera: Conjunctivae normal.      Pupils: Pupils are equal, round, and reactive to light.      Comments: Minimal pterygium on left eye   Cardiovascular:      Rate and Rhythm: Normal rate and regular rhythm.      Pulses: Normal pulses.      Heart sounds: Normal heart sounds. No murmur heard.     Pulmonary:      Effort: Pulmonary effort is normal.      Breath sounds: Normal breath sounds.   Abdominal:      General: Abdomen is flat. Bowel sounds are normal.      Palpations: Abdomen is soft.      Tenderness: There is no abdominal tenderness.   Musculoskeletal:         General: Normal range of motion.      Cervical back: Normal range of motion and neck supple. No rigidity or tenderness.      Right lower leg: No edema.      Left lower leg: No edema.      Comments: Left heel    Minmal extosis area on left heel  No ttp  No swelling or redness    Lymphadenopathy:      Cervical: No cervical adenopathy.   Skin:     General: Skin is warm and dry.   Neurological:      General: No focal deficit present.      Mental Status: She is alert and oriented to person, place, and time. Mental status is at baseline.      Motor: No weakness.      Coordination: Coordination normal.      Gait: Gait normal.   Psychiatric:         Mood and Affect: Mood normal.         Behavior: Behavior normal.         Thought Content: Thought content normal.         Judgment: Judgment normal.          Assessment:     1. Encounter for well adult exam without abnormal findings    2. Need for vaccination  - Flu vaccine QUADRIVALENT (PF) 6 months and older (FLULAVAL/FLUARIX)    3. Screening for diabetes mellitus (DM)  - Comprehensive metabolic panel    4. Screening for lipid disorders  - Lipid panel    5. Fatigue, unspecified type  - TSH, Abn Reflex to Free T4, Serum  - Vitamin B12  - Vitamin D,25 OH, Total  - CBC and differential  - IRON PROFILE  - Ferritin  - Lyme Ab Tot Rflx to WB IGG/IGM    6. Atypical chest pain  - XR Chest 2 Views        Plan:     Good exam today and vital signs    I personally counselled patient and/or parent about the importance of vaccines and the risk/benefit of this administration today flu shot  - Nurse also with review and does the administration    Multiple issues brought to my attention today    No concerns on this exam today    Need to do chest xray and given order now    Need to see cardiology for check on "chest pain"    Here are names in the area for cardiologist to call and be seen for chest pain    [facility] ---> MANY locations and  please look at [email or web address]  - online scheduling available    [facility] - [facility] - [facility] - Einar Gip  [facility] - [facility] - [facility] - [facility] - [facility] -  [facility] - [facility] - [place]    OR      [facility]   [phone]  [email or web address]    OR    [facility]   [phone]  [email or web address]        Need to contact dermatology to do skin check     [facility]  [facility] [place] [facility]  [address],  [place], [place] [address]  [phone]    [facility]  Dr. Wyman Songster  [address]  [place], [place] [address] [phone]  [email or web address]    [facility]  Dr Florentina Addison  [address]  [place], [place] [address] [phone]  [email or web address]    [facility]  Dermatology  [address], [place], [place] [address]  Ph. [phone], Fax [phone]    [facility]  Dr. Evette Cristal [address]  [place], [place] [address] [phone]  [email or web address]      [facility] [place]  Dr. Waymond Cera  [address] [place], [place] [address] [phone]  [email or web address]    [facility]  Dr. 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 Allegra Grana Dr. Rene Kocher, OD  [address]  [place], [place] [address] [phone] [email or web address]    Dr. Clementeen Graham  [address]  [place], [place] [address] [phone]    Dr. Gerrit Halls  [address]   [place], [place] [address] [phone]    Dr Tana Conch  [facility]    [address]  [place], [place] [address]  Phone: [phone]  Fax: [phone]    [facility]  [address]  [place], [place] [address]  [phone]  [email or web address]    Left heel bump no concerns on exam    Please remember to fill our OFFICE SURVEY after your visit to let us know how we are doing.  Thank you !!!    Make sure to do FLU SHOT after 18 Feb 2020 and if not here in the office recommend [facility], [facility], or any local grocery        FOR SCHEDULING to AVOID calling the office     PLEASE USE MyChart    You can always schedule 15 minute OFFICE or VIDEO VISITS online  through MyChart on a daily basis     REMEMBER you can ONLY do video visits if you are in the state of [place] with our providers.  You cannot be in [place] and do a video visit with a provider for example.    To make an office visit or  video visit just go to "Visits" at the top and click on "Schedule an appointment" then click on a provider then reason for visit is "office visit" or "video visit"  and then pick date and time and self schedule    REMEMBER for Video Visits    Our office will send you a link to to log into the [email or web address] video visit website so please wait for this and click on this on your email or cell phone  - DO NOT log into the MyChart video visit as our office currently DOES NOT use this product    ALSO    Send a message to our staff to schedule IN OFFICE physical or preop check     AND     You can write a message requesting a physical/office visit through MyChart message provider  - Recommend if you are requesting an in office visit or physical to write the dates you are available and if you want to be seen in the AM or PM so we can  expedite the process of scheduling for you.  You can write a message requesting a physical/office visit through MyChart message provider    Also     You can google "[facility]"  and click on "Book an appointment"    Find a provider and time that fits your needs do to visit    If video visit then schedule online    If needing office visit or physical then will have to call  the office at [phone] so we can check your benefits and make sure your provider is in the office      Discussion of medicines, use, and side effects as applicable  Importance of avoiding tobacco, excessive alcohol, ilicit drug use, high risk sexual behavior and sun exposure is reviewed.  Seat belt use is encouraged.  If own firearm, make sure you have taken firearm classes. Keep unloaded and safe storage is imperative  Importance of regular moderate exercise and low fat high fiber diet that includes 5 servings of fruits and vegetables a day is discussed.    Let us know if any issues  Maryanna Shape, DO

## 2020-02-26 ENCOUNTER — Encounter (INDEPENDENT_AMBULATORY_CARE_PROVIDER_SITE_OTHER): Payer: Self-pay

## 2020-02-27 LAB — THYROID STIMULATING HORMONE (TSH), REFLEX ON ABNORMAL TO FREE T4, SERUM: TSH: 1.65 u[IU]/mL (ref 0.450–4.500)

## 2020-02-27 LAB — CBC AND DIFFERENTIAL
Baso(Absolute): 0 10*3/uL (ref 0.0–0.2)
Basos: 1 %
Eos: 2 %
Eosinophils Absolute: 0.1 10*3/uL (ref 0.0–0.4)
Hematocrit: 41.8 % (ref 34.0–46.6)
Hemoglobin: 13.5 g/dL (ref 11.1–15.9)
Immature Granulocytes Absolute: 0 10*3/uL (ref 0.0–0.1)
Immature Granulocytes: 1 %
Lymphocytes Absolute: 1 10*3/uL (ref 0.7–3.1)
Lymphocytes: 27 %
MCH: 30.8 pg (ref 26.6–33.0)
MCHC: 32.3 g/dL (ref 31.5–35.7)
MCV: 95 fL (ref 79–97)
Monocytes Absolute: 0.2 10*3/uL (ref 0.1–0.9)
Monocytes: 6 %
Neutrophils Absolute: 2.5 10*3/uL (ref 1.4–7.0)
Neutrophils: 63 %
Platelets: 232 10*3/uL (ref 150–450)
RBC: 4.39 x10E6/uL (ref 3.77–5.28)
RDW: 13.1 % (ref 11.7–15.4)
WBC: 3.8 10*3/uL (ref 3.4–10.8)

## 2020-02-27 LAB — IRON PROFILE
Iron Saturation: 31 % (ref 15–55)
Iron: 94 ug/dL (ref 27–159)
TIBC: 305 ug/dL (ref 250–450)
UIBC: 211 ug/dL (ref 131–425)

## 2020-02-27 LAB — COMPREHENSIVE METABOLIC PANEL
ALT: 19 IU/L (ref 0–32)
AST (SGOT): 20 IU/L (ref 0–40)
African American eGFR: 123 mL/min/{1.73_m2} (ref >59)
Albumin/Globulin Ratio: 1.5 (ref 1.2–2.2)
Albumin: 4.6 g/dL (ref 3.8–4.8)
Alkaline Phosphatase: 68 IU/L (ref 48–121)
BUN / Creatinine Ratio: 23 (ref 9–23)
BUN: 16 mg/dL (ref 6–24)
Bilirubin, Total: 0.3 mg/dL (ref 0.0–1.2)
CO2: 23 mmol/L (ref 20–29)
Calcium: 9.5 mg/dL (ref 8.7–10.2)
Chloride: 103 mmol/L (ref 96–106)
Creatinine: 0.69 mg/dL (ref 0.57–1.00)
Globulin, Total: 3.1 g/dL (ref 1.5–4.5)
Glucose: 102 mg/dL — ABNORMAL HIGH (ref 65–99)
Potassium: 4.6 mmol/L (ref 3.5–5.2)
Protein, Total: 7.7 g/dL (ref 6.0–8.5)
Sodium: 139 mmol/L (ref 134–144)
non-African American eGFR: 107 mL/min/{1.73_m2} (ref >59)

## 2020-02-27 LAB — LIPID PANEL
Cholesterol / HDL Ratio: 3.2 ratio (ref 0.0–4.4)
Cholesterol: 185 mg/dL (ref 100–199)
HDL: 58 mg/dL (ref >39)
LDL Chol Calculated (NIH): 111 mg/dL — ABNORMAL HIGH (ref 0–99)
Triglycerides: 89 mg/dL (ref 0–149)
VLDL Calculated: 16 mg/dL (ref 5–40)

## 2020-02-27 LAB — LYME AB, TOTAL,REFLEX TO WESTERN BLOT (IGG & IGM)
Lyme Disease Ab, Quant, IgM: <0.8 index (ref 0.00–0.79)
Lyme IgG/IgM Ab: <0.91 {ISR} (ref 0.00–0.90)

## 2020-02-27 LAB — VITAMIN B12: Vitamin B-12: 723 pg/mL (ref 232–1245)

## 2020-02-27 LAB — VITAMIN D,25 OH,TOTAL: Vitamin D 25-Hydroxy: 32.1 ng/mL (ref 30.0–100.0)

## 2020-02-27 LAB — FERRITIN: Ferritin: 66 ng/mL (ref 15–150)

## 2020-03-01 ENCOUNTER — Emergency Department
Admission: EM | Admit: 2020-03-01 | Discharge: 2020-03-01 | Disposition: A | Discharge status: Home / Self Care | Payer: No Typology Code available for payment source | Attending: Emergency Medicine | Admitting: Emergency Medicine

## 2020-03-01 ENCOUNTER — Emergency Department: Payer: No Typology Code available for payment source

## 2020-03-01 DIAGNOSIS — R0789 Other chest pain: Secondary | ICD-10-CM | POA: Insufficient documentation

## 2020-03-01 LAB — CBC AND DIFFERENTIAL
Basophils Absolute Automated: 0.02 10*3/uL (ref 0.00–0.08)
Basophils Automated: 0.3 %
Eosinophils Absolute Automated: 0.12 10*3/uL (ref 0.00–0.44)
Eosinophils Automated: 2 %
Hematocrit: 39.7 % (ref 34.7–43.7)
Hgb: 13.2 g/dL (ref 11.4–14.8)
Lymphocytes Absolute Automated: 1.7 10*3/uL (ref 0.42–3.22)
Lymphocytes Automated: 27.8 %
MCH: 31 pg (ref 25.1–33.5)
MCHC: 33.2 g/dL (ref 31.5–35.8)
MCV: 93.2 fL (ref 78.0–96.0)
MPV: 10 fL (ref 8.9–12.5)
Monocytes Absolute Automated: 0.45 10*3/uL (ref 0.21–0.85)
Monocytes: 7.4 %
Neutrophils Absolute: 3.82 10*3/uL (ref 1.10–6.33)
Neutrophils: 62.5 %
Platelets: 202 10*3/uL (ref 142–346)
RBC: 4.26 10*6/uL (ref 3.90–5.10)
RDW: 14 % (ref 11–15)
WBC: 6.11 10*3/uL (ref 3.10–9.50)

## 2020-03-01 LAB — COMPREHENSIVE METABOLIC PANEL
ALT: 17 U/L (ref 0–55)
AST (SGOT): 16 U/L (ref 5–34)
Albumin/Globulin Ratio: 1.3 (ref 0.9–2.2)
Albumin: 3.8 g/dL (ref 3.5–5.0)
Alkaline Phosphatase: 57 U/L (ref 37–106)
Anion Gap: 8 (ref 5.0–15.0)
BUN: 14 mg/dL (ref 7.0–19.0)
Bilirubin, Total: 0.3 mg/dL (ref 0.2–1.2)
CO2: 26 mEq/L (ref 22–29)
Calcium: 9.1 mg/dL (ref 8.5–10.5)
Chloride: 105 mEq/L (ref 100–111)
Creatinine: 0.7 mg/dL (ref 0.6–1.0)
Globulin: 3 g/dL (ref 2.0–3.6)
Glucose: 108 mg/dL — ABNORMAL HIGH (ref 70–100)
Potassium: 3.5 mEq/L (ref 3.5–5.1)
Protein, Total: 6.8 g/dL (ref 6.0–8.3)
Sodium: 139 mEq/L (ref 136–145)

## 2020-03-01 LAB — ECG 12-LEAD
Atrial Rate: 79 {beats}/min
P Axis: 79 degrees
P-R Interval: 118 ms
Q-T Interval: 370 ms
QRS Duration: 88 ms
QTC Calculation (Bezet): 424 ms
R Axis: 75 degrees
T Axis: 55 degrees
Ventricular Rate: 79 {beats}/min

## 2020-03-01 LAB — GFR: EGFR: >60

## 2020-03-01 LAB — I-STAT TROPONIN: i-STAT Troponin: 0 ng/mL (ref 0.00–0.09)

## 2020-03-01 NOTE — ED Provider Notes (Signed)
[facility] EMERGENCY DEPARTMENT H&P      Visit date: 03/01/2020      CLINICAL SUMMARY           Diagnosis:    .     Final diagnoses:   Other chest pain         MDM Notes:      This is a 44 year old woman who presents with chest pain. Her HEART score is low and she confirms that her pain is in the setting of the recent death of her father which may contributing to some stress. Her EKG and labs are normal here. I think it's unlikely that this is ACS but close follow up is certainly warranted. She has an appt with cardiology in October, but I will also provide the phone number for our cardiologist in case she would like to try and move up her appt.            Disposition:         Discharge           I was present for the bedside discharge alongside the patient's ED nurse. Course of visit in the ED and discharge instructions were reviewed with patient and they were given the opportunity to ask any questions regarding their care today. Patient and/ or patient's family verbalized understanding of, and comfort with, instructions and plan.          Discharge Prescriptions     None                         CLINICAL INFORMATION        HPI:      Chief Complaint: Chest Pain  .     Leah Morgan is a 44 y.o. female who presents with chest pain. It started a week and half ago when she was at the beach and she noted a slight bit of "discomfort" in her left chest. Since then, it has come and gone but it does not really radiate to her jaw or her arm she says. She has not had any fevers, cough, congestoin, nausea, vomiting or diarrhea. She has no known h/x CADz, and she does not smoke. Several men on her father's side have had heart disease but no one with early onset. She is pain free at this time. She says it randomly comes and goes and is not associated with exertion. She was referred to cardiology by her PCP but the appt is not until October and she was worried that she needed to be seen  before then.      History obtained from: Patient          ROS:      12 system ros neg except as per HPI      Physical Exam:      Pulse 78  BP 112/59  Resp 16  SpO2 99 %  Temp 99.3 F (37.4 C)    Physical Exam  Vitals and nursing note reviewed.   Constitutional:       Appearance: Normal appearance.   HENT:      Head: Normocephalic and atraumatic.      Right Ear: External ear normal.      Left Ear: External ear normal.   Eyes:      Extraocular Movements: Extraocular movements intact.      Conjunctiva/sclera: Conjunctivae normal.      Pupils: Pupils are equal, round, and reactive to  light.   Cardiovascular:      Rate and Rhythm: Normal rate and regular rhythm.      Heart sounds: Normal heart sounds. No murmur heard.   No friction rub. No gallop.    Pulmonary:      Effort: Pulmonary effort is normal. No respiratory distress.      Breath sounds: Normal breath sounds. No stridor. No wheezing, rhonchi or rales.   Chest:      Chest wall: No tenderness.   Abdominal:      General: Abdomen is flat. Bowel sounds are normal.      Palpations: Abdomen is soft.   Musculoskeletal:         General: Normal range of motion.      Cervical back: Normal range of motion and neck supple.   Skin:     General: Skin is warm and dry.   Neurological:      General: No focal deficit present.      Mental Status: She is alert and oriented to person, place, and time. Mental status is at baseline.   Psychiatric:         Mood and Affect: Mood normal.         Behavior: Behavior normal.         Thought Content: Thought content normal.         Judgment: Judgment normal.                  PAST HISTORY        Primary Care Provider: Jeanette Caprice, MD        PMH/PSH:    .     Past Medical History:   Diagnosis Date   . Seasonal allergic rhinitis        She has a past surgical history that includes Foot surgery; Dental surgery; RECONSTRUCTION, SEPTAL; and Colonoscopy (07/21/2019).      Social/Family History:      She reports that she has never smoked.  She has never used smokeless tobacco. She reports current alcohol use. She reports that she does not use drugs.    Family History   Problem Relation Age of Onset   . Hypertension Mother    . Hypertension Father    . Hyperlipidemia Father    . Heart disease Father    . Kidney disease Father    . Diabetes Maternal Grandmother    . Cancer Maternal Grandmother    . Glaucoma Maternal Grandmother    . Diabetes Maternal Grandfather    . Cancer Maternal Grandfather    . Diabetes Paternal Grandmother    . Kidney disease Brother    . Cancer Paternal Aunt          Listed Medications on Arrival:    .     Home Medications     Med List Status: In Progress Set By: Dena Billet, RN at 03/01/2020  7:12 PM                Ascorbic Acid (VITAMIN C) 100 MG tablet     Take 100 mg by mouth daily     Calcium Carbonate Antacid (ALKA-SELTZER ANTACID PO)     Take by mouth     Paragard Intrauterine Copper IUD     by Intrauterine route     VITAMIN D PO     Take by mouth         Allergies: She has No Known Allergies.  VISIT INFORMATION        Clinical Course in the ED:                 Medications Given in the ED:    .     ED Medication Orders (From admission, onward)    None            Procedures:      Procedures      Interpretations:      EKG -             interpreted by me: normal sinus at 79.  Clinical significance: normal EKG  DDx-              Unstable angina, acute MI, MSK chest pain, pneumonia                 RESULTS        Lab Results:      Results     Procedure Component Value Units Date/Time    CBC and differential [[id number]] Collected: 03/01/20 0750    Specimen: Blood Updated: 03/01/20 2024     WBC 6.11 x10 3/uL      Hgb 13.2 g/dL      Hematocrit 40.9 %      Platelets 202 x10 3/uL      RBC 4.26 x10 6/uL      MCV 93.2 fL      MCH 31.0 pg      MCHC 33.2 g/dL      RDW 14 %      MPV 10.0 fL      Neutrophils 62.5 %      Lymphocytes Automated 27.8 %      Monocytes 7.4 %      Eosinophils Automated 2.0 %      Basophils Automated  0.3 %      Immature Granulocytes Unmeasured %      Nucleated RBC Unmeasured /100 WBC      Neutrophils Absolute 3.82 x10 3/uL      Lymphocytes Absolute Automated 1.70 x10 3/uL      Monocytes Absolute Automated 0.45 x10 3/uL      Eosinophils Absolute Automated 0.12 x10 3/uL      Basophils Absolute Automated 0.02 x10 3/uL      Immature Granulocytes Absolute Unmeasured x10 3/uL      Absolute NRBC Unmeasured x10 3/uL     GFR [[id number]] Collected: 03/01/20 0750     Updated: 03/01/20 2018     EGFR >60.0    Comprehensive metabolic panel [[id number]]  (Abnormal) Collected: 03/01/20 0750    Specimen: Blood Updated: 03/01/20 2018     Glucose 108 mg/dL      BUN 57.8 mg/dL      Creatinine 0.7 mg/dL      Sodium 469 mEq/L      Potassium 3.5 mEq/L      Chloride 105 mEq/L      CO2 26 mEq/L      Calcium 9.1 mg/dL      Protein, Total 6.8 g/dL      Albumin 3.8 g/dL      AST (SGOT) 16 U/L      ALT 17 U/L      Alkaline Phosphatase 57 U/L      Bilirubin, Total 0.3 mg/dL      Globulin 3.0 g/dL      Albumin/Globulin Ratio 1.3     Anion Gap 8.0    i-Stat Troponin [[id number]] Collected: 03/01/20 0750  Updated: 03/01/20 2002     i-STAT Troponin 0.00 ng/mL               Radiology Results:      Chest 2 Views   Final Result    Negative for acute process      Gerlene Burdock, MD    03/01/2020 6:31 PM                  Scribe Attestation:      No scribe involved in the care of this patient          Lanae Crumbly, MD  03/01/20 2033

## 2020-03-01 NOTE — ED Notes (Signed)
Radiology called - patient is ready for imaging.

## 2020-03-01 NOTE — Discharge Instructions (Signed)
Karl Pock por elegir un Information systems manager (Emergency Department) de [facility], el primero en el rea de [place].  Espero que su consulta de hoy haya sido [place].      Thank you for choosing an [facility] Emergency Department, the premier emergency department in the [place] area.  I hope your visit today was EXCELLENT.                       Instrucciones especficas para su consulta de hoy:      Specific instructions for your visit today:        Chest pain of unclear etiology          SI USTED NO SIGUE MEJORANDO O SI SU CONDICIN EMPEORA, COMUNQUESE CON SU MDICO O ACUDA DE INMEDIATO AL DEPARTAMENTO DE EMERGENCIAS.        IF YOU DO NOT CONTINUE TO IMPROVE OR YOUR CONDITION WORSENS, PLEASE CONTACT YOUR DOCTOR OR RETURN IMMEDIATELY TO THE EMERGENCY DEPARTMENT.                    Wallie Char, MD  Mdico especialista en emergencias  [place] de emergencias de [facility]        Sincerely,  Lanae Crumbly, MD  Attending Emergency Physician  [facility] Emergency Department                CMO OBTENER UNA CITA PARA RECIBIR ATENCIN MDICA PRIMARIA    Los mdicos de atencin primaria (PCP, por sus siglas en ingls) son internistas o mdicos de cabecera. Ambos tipos de PCP se enfocan en el fomento de [employer], la prevencin de [place], la educacin y la asesora del paciente y el tratamiento de condiciones mdicas agudas y crnicas.    Llame para concertar una cita con un mdico de atencin primaria.  Pregunte qu mdico est recibiendo [employer].     [facility]  [place]: [phone]  [email or web address]     OBTAINING A PRIMARY CARE APPOINTMENT    Primary care physicians (PCPs, also known as primary care doctors) are either internists or family medicine doctors. Both types of PCPs focus on health promotion, disease prevention, patient education and counseling, and treatment of acute and chronic medical  conditions.    Call for an appointment with a primary care doctor.  Ask to see who is taking new patients.       [facility]  telephone:  [phone]  [email or web address]             Si desea programar una cita con un pediatra, llame a la siguiente lnea de informacin de [place].  Tambin puede llamar para programar una cita con la clnica peditrica [facility] (excepto si su seguro es [facility] o [facility]):    [address]  [place], [place] [address]  [phone]   For a pediatrician, call the [facility] referral line below.  You can also call to make an appointment at [facility] (except [facility] and [facility]):    [address]  [place], [place] [address]  [phone]             REMISIONES A MDICOS  Llame al [phone] (disponible las 24 horas al da, los 7 das de la semana) si [employer] remisin y podremos ayudarlo a Clinical research associate un mdico de atencin primaria o Music therapist.  Tambin est disponible  en lnea en la pgina web: [email or web address]     DOCTOR REFERRALS  Call [phone] (available 24 hours a day, 7 days a week) if you need any further referrals and we can help you find a primary care doctor or specialist.  Also, available online at:  [email or web address]             INFORMACIN DE CONTACTO  Antes de marcharse, verifique en el registro que su nmero de contacto est actualizado.  Puede llamar al registro al [phone], Option 7 [facility]) or [phone], Option 1 ([place]) para actualizar su informacin.  Si tiene preguntas sobre la factura del hospital, llame al [phone].  Si tiene preguntas sobre la factura del mdico del departamento de emergencias, llame al [phone].       YOUR CONTACT INFORMATION  Before leaving please check with registration to make sure we have an up-to-date contact number.  You can call registration at [phone], Option 7 [facility]) or [phone], Option 1 ([place]) to update your information.  For questions about your hospital bill, please call [phone].  For questions about your Emergency Dept Physician bill please call [phone].               SERVICIOS MDICOS GRATUITOS  Si necesita ayuda con servicios mdicos o sociales, llame al 2-1-1 para recibir informacin [employer] recursos en su rea.  2-1-1 es un servicio gratuito que brinda a las personas informacin sobre seguros mdicos, clnicas gratuitas, Psychiatrist, salud mental, atencin dental, apoyo alimentario, vivienda y asesora para tratar la drogadiccin.  Tambin est disponible en lnea en la pgina web: http://www.RankInsider.nl.     FREE HEALTH SERVICES  If you need help with health or social services, please call 2-1-1 for a free referral to resources in your area.  2-1-1 is a free service connecting people with information on health insurance, free clinics, pregnancy, mental health, dental care, food assistance, housing, and substance abuse counseling.  Also, available online at:  http://www.211virginia.org             EXPEDIENTES MDICOS Y PRUEBAS  Los resultados de [address] pruebas de laboratorio no se reciben el mismo da, [employer] del cultivo de [place].   Nos comunicaremos con usted si observamos otros hallazgos de importancia.  Las placas de radiologa se suelen revisar dos veces para garantizar la exactitud.  Si existe alguna discrepancia, se lo notificaremos.      Llame al [phone] [facility] location) or [phone] ([place] location) para recoger un disco compacto (CD) de obsequio con los estudios radiolgicos que se le hicieron.  Si usted o su mdico desean solicitar una copia de su expediente mdico, llame al [phone].       MEDICAL RECORDS AND TESTS  Certain laboratory test results do not come back the same day, for example urine cultures.   We will contact you if other important findings are noted.  Radiology films are  often reviewed again to ensure accuracy.  If there is any discrepancy, we will notify you.      Please call [phone] [facility] location) or [phone] ([place] location) to pick up a complimentary CD of any radiology studies performed.  If you or your doctor would like to request a copy of your medical records, please call [phone].             LESIONES ORTOPDICAS   Tenga en cuenta que puede haber lesiones considerables incluso en casos en los que una radiografa inicial arroje [employer] o  negativos.  Esto puede deberse a que algunas fracturas (huesos rotos) no son visibles inicialmente en las radiografas.  Por este motivo, es necesario que el mdico de atencin primaria o un Glass blower/designer en huesos (ortopeda o Barista) le hagan un seguimiento ambulatorio detallado.     ORTHOPEDIC INJURY   Please know that significant injuries can exist even when an initial x-ray is read as normal or negative.  This can occur because some fractures (broken bones) are not initially visible on x-rays.  For this reason, close outpatient follow-up with your primary care doctor or bone specialist (orthopedist) is required.             MEDICAMENTOS Y SEGUIMIENTO  Tenga en cuenta que algunos medicamentos prescritos pueden producir somnolencia.  Sea cuidadoso al [employer] u operar maquinaria mientras toma estos medicamentos.    Las evaluaciones y los tratamientos que recibi en nuestro departamento de emergencias se proveen en caso de emergencia y no tienen el propsito de Microbiologist a su mdico de atencin primaria.  Es importante que su mdico lo examine de nuevo y que usted le notifique cualquier problema nuevo o que [place].       MEDICATIONS AND FOLLOWUP  Please be aware that some prescription medications can cause drowsiness.  Use caution when driving or operating machinery.    The examination and treatment you have received in our Emergency Department is provided on an emergency basis, and is not  intended to be a substitute for your primary care physician.  It is important that your doctor checks you again and that you report any new or remaining problems at that time.               FARMACIAS DE 24 HORAS AL DA    [facility]  [address]  [place], [place] [address]  [phone]    [facility]  [address]  [place], [place] [address]  [phone]   24 HOUR PHARMACIES    [facility] at [facility]  [address]  [place], [place] [address]  [phone]    [facility]  [address]  [place], [place] [address]  [phone]             ASISTENCIA CON EL SEGURO    Ley de Atencin de Salud Asequible (ACA, por sus siglas en ingls)  Llame para comenzar o terminar una solicitud, comparar planes, inscribirse o hacer preguntas.  [phone]  TTY: [phone]  Pgina web: Healthcare.gov    Ayuda para inscribirse en Medicaid  Cover [place]  [phone] (LNEA GRATUITA)  [phone] (TTY)  Pgina web: http://www.coverva.org    Ayuda local para inscribirse en la ACA  [place] [facility] (NVFS)  [phone] (CENTRAL TELEFNICA)  Correo electrnico: health-help@nvfs .org  Pgina web: [place].com.ee  Direccin: [address], [address] [place], [place] [address]     ASSISTANCE WITH INSURANCE    Affordable Care Act  (ACA)  Call to start or finish an application, compare plans, enroll or ask a question.  [phone]  TTY: [phone]  Web:  Healthcare.gov    Help Enrolling in [facility]  Cover [place]  ([phone] (TOLL-FREE)  [phone] (TTY)  Web:  Http://www.coverva.org    Local Help Enrolling in the [facility]  [place] [facility]  [phone] (MAIN)  Email:  health-help@nvfs .org  Web:  BlackjackMyths.is  Address:  [address] [place], [place] [address]             MEDICAMENTOS Wesley Desanctis  Los medicamentos sedantes abarcan medicamentos fuertes para Chief Technology Officer (p. ej., narcticos), relajantes musculares, benzodiacepinas (se usan para combatir la  ansiedad y como relajante muscular), Benadryl o difenhidramina y dems antihistamnicos que se usan para combatir las Therapist, art o la comezn y otros medicamentos.  Si no est seguro si se le suministr un medicamento sedante, pregunte a su mdico o enfermero(a).  Si se le suministr un medicamento sedante: NO conduzca un automvil. NO opere maquinaria. NO realice trabajos en los que deba estar alerta.  NO consuma bebidas alcohlicas mientras tome este medicamento.   SEDATING MEDICATIONS  Sedating medications include strong pain medications (e.g. narcotics), muscle relaxers, benzodiazepines (used for anxiety and as muscle relaxers), Benadryl/diphenhydramine and other antihistamines for allergic reactions/itching, and other medications.  If you are unsure if you have received a sedating medication, please ask your physician or nurse.  If you received a sedating medication: DO NOT drive a car. DO NOT operate machinery. DO NOT perform jobs where you need to be alert.  DO NOT drink alcoholic beverages while taking this medicine.               Si se siente mareado, sintese o recustese al observar los primeros sntomas. [place] y baje las escaleras con cuidado.  Tenga extremo cuidado para evitar cadas.   If you get dizzy, sit or lie down at the first signs. Be careful going up and down stairs.  Be extra careful to prevent falls.             Nunca d este medicamento a Economist.   Never give this medicine to others.             Mantenga este medicamento fuera del alcance de los nios.   Keep this medicine out of reach of children.             No tome o guarde medicamentos viejos. Deschelos cuando se venzan.     Guarde los [employer] en un lugar seco y fresco. NO los guarde en el gabinete de medicinas del bao o en un gabinete que se encuentre encima de la estufa.   Do not take or save old medicines. Throw them away when outdated.     Keep all medicines in a cool, dry place. DO NOT keep them in your  bathroom medicine cabinet or in a cabinet above the stove.             RESURTIDO DE MEDICAMENTOS  Tenga en cuenta que no podemos resurtir medicamentos prescritos en la sala de emergencias. Si necesita un tratamiento adicional al que se Radiographer, therapeutic en la sala de Sports administrator, consulte a su mdico de atencin primaria o al especialista en el tratamiento del dolor.   MEDICATION REFILLS  Please be aware that we cannot refill any prescriptions through the ER. If you need further treatment from what is provided at your ER visit, please follow up with your primary care doctor or your pain management specialist.

## 2020-03-12 ENCOUNTER — Encounter (INDEPENDENT_AMBULATORY_CARE_PROVIDER_SITE_OTHER): Payer: Self-pay | Admitting: Family Medicine

## 2020-03-26 ENCOUNTER — Encounter (INDEPENDENT_AMBULATORY_CARE_PROVIDER_SITE_OTHER): Payer: Self-pay

## 2020-03-27 ENCOUNTER — Encounter (INDEPENDENT_AMBULATORY_CARE_PROVIDER_SITE_OTHER): Payer: Self-pay

## 2020-04-01 ENCOUNTER — Encounter (INDEPENDENT_AMBULATORY_CARE_PROVIDER_SITE_OTHER): Payer: Self-pay | Admitting: Family Medicine

## 2020-04-08 ENCOUNTER — Encounter (INDEPENDENT_AMBULATORY_CARE_PROVIDER_SITE_OTHER): Payer: Self-pay | Admitting: Family Medicine

## 2020-04-26 ENCOUNTER — Encounter (INDEPENDENT_AMBULATORY_CARE_PROVIDER_SITE_OTHER): Payer: Self-pay

## 2020-04-27 ENCOUNTER — Encounter (INDEPENDENT_AMBULATORY_CARE_PROVIDER_SITE_OTHER): Payer: Self-pay

## 2020-05-26 ENCOUNTER — Encounter (INDEPENDENT_AMBULATORY_CARE_PROVIDER_SITE_OTHER): Payer: Self-pay

## 2020-05-27 ENCOUNTER — Encounter (INDEPENDENT_AMBULATORY_CARE_PROVIDER_SITE_OTHER): Payer: Self-pay

## 2020-06-26 ENCOUNTER — Encounter (INDEPENDENT_AMBULATORY_CARE_PROVIDER_SITE_OTHER): Payer: Self-pay

## 2020-06-27 ENCOUNTER — Encounter (INDEPENDENT_AMBULATORY_CARE_PROVIDER_SITE_OTHER): Payer: Self-pay

## 2020-06-30 ENCOUNTER — Telehealth (INDEPENDENT_AMBULATORY_CARE_PROVIDER_SITE_OTHER): Payer: No Typology Code available for payment source | Admitting: Family Medicine

## 2020-06-30 ENCOUNTER — Encounter (INDEPENDENT_AMBULATORY_CARE_PROVIDER_SITE_OTHER): Payer: Self-pay | Admitting: Family Medicine

## 2020-06-30 DIAGNOSIS — R42 Dizziness and giddiness: Secondary | ICD-10-CM

## 2020-06-30 DIAGNOSIS — R110 Nausea: Secondary | ICD-10-CM

## 2020-06-30 DIAGNOSIS — R059 Cough, unspecified: Secondary | ICD-10-CM

## 2020-06-30 DIAGNOSIS — J069 Acute upper respiratory infection, unspecified: Secondary | ICD-10-CM

## 2020-06-30 MED ORDER — ALBUTEROL SULFATE HFA 108 (90 BASE) MCG/ACT IN AERS
2.0000 | INHALATION_SPRAY | RESPIRATORY_TRACT | 0 refills | Status: DC | PRN
Start: 2020-06-30 — End: 2020-07-23

## 2020-06-30 MED ORDER — ONDANSETRON HCL 4 MG PO TABS
ORAL_TABLET | ORAL | 1 refills | Status: DC
Start: 2020-06-30 — End: 2021-04-20

## 2020-06-30 NOTE — Progress Notes (Signed)
[facility] [place] [facility]                       Date of Virtual Visit: 06/30/2020 11:01 AM        Patient ID: Leah Morgan is a 45 y.o. female.  Attending Physician: Maryanna Shape, DO       Telemedicine Eligibility:    State Location:  [x]  [place]  []  [place]  []  [place] []  [place]  []  Other:    Physical Location:  [x]  Home  []         []        []          []  Other:    Patient Identity Verification:  [x]  State Issued ID  []  Insurance Eligibility Check  []  Other:    Physical Address Verification: (for 911)  [x]  Yes  []  No    Personal identity shared with patient:  [x]  Yes  []  No    Education on nature of video visit shared with patient:  [x]  Yes  []  No    Emergency plan agreed upon with patient:  [x]  Yes  []  No    If the patient had not had this virtual visit, what would they have done?  []         []         []        []          []  Other:    Visit terminated since not appropriate for virtual care:  [x]  N/A  []  Reason:         Chief Complaint:    Chief Complaint   Patient presents with   . Illness               HPI:    Video Visit today      This visit is being conducted via video today    Verbal consent has been obtained from the patient to conduct a video visit  visit to minimize exposure to COVID-19: YES    Today video server is not functioning and with pt permission will have to change to audio call now for care and patient agrees    Total time of visit was 20+ minutesfor chart review before appointment, updating chart, actual patient visit, and time spent creating assesmment and plan along with MDM is coded as 99214    Last few days    Fever or chills yes  Cough yes  Shortness of breath or difficulty breathing none  Fatigue yes  Muscle or body aches none  Headache yes  New loss of taste or smell yes  Sore throat none  Congestion or runny nose none  Nausea or vomiting none  Diarrhea none    Have you been exposed to someone with Covid 19: yes +  relatives in the office     Have you been recently tested for Covid 19: none    Have you been vaccinated for Covid 19: yes    Are you on antibiotics: yes early in month for other illness    What OTC medicines are you taking: none    Some nausea and vertigo             Problem List:    Patient Active Problem List   Diagnosis   . Uterine fibroid   . Family history of diabetes mellitus type II   . Hyperprolactinemia  Current Meds:    No outpatient medications have been marked as taking for the 06/30/20 encounter (Telemedicine Visit) with Maryanna Shape, DO.          Allergies:    No Known Allergies          Past Surgical History:    Past Surgical History:   Procedure Laterality Date   . COLONOSCOPY  07/21/2019   . DENTAL SURGERY     . FOOT SURGERY     . RECONSTRUCTION, SEPTAL             Family History:    Family History   Problem Relation Age of Onset   . Hypertension Mother    . Hypertension Father    . Hyperlipidemia Father    . Heart disease Father    . Kidney disease Father    . Diabetes Maternal Grandmother    . Cancer Maternal Grandmother    . Glaucoma Maternal Grandmother    . Diabetes Maternal Grandfather    . Cancer Maternal Grandfather    . Diabetes Paternal Grandmother    . Kidney disease Brother    . Cancer Paternal Aunt            Social History:    Social History     Tobacco Use   . Smoking status: Never Smoker   . Smokeless tobacco: Never Used   Vaping Use   . Vaping Use: Never used   Substance Use Topics   . Alcohol use: Yes     Comment: 4 drinks per week   . Drug use: Never          The following sections were reviewed this encounter by the provider:   Tobacco  Allergies  Meds  Problems  Med Hx  Surg Hx  Fam Hx             Vital Signs:    There were no vitals taken for this visit.         ROS:    Review of Systems   Constitutional: Positive for fatigue.   HENT: Positive for congestion, postnasal drip, rhinorrhea and voice change.    Respiratory: Positive for cough and chest  tightness. Negative for apnea, choking, shortness of breath, wheezing and stridor.    Neurological: Positive for dizziness and headaches.              Physical Exam:    Physical Exam   GENERAL APPEARANCE: alert, in no acute distress, pleasant, well nourished.   HEAD: normal appearance  EYES: no discharge  EARS: normal hearing  NECK/THYROID: appearance -supple  PSYCH: appropriate affect, appropriate mood, normal speech, normal attention        Assessment:    1. Acute upper respiratory infection  - COVID-19 (SARS-CoV-2) [facility]out [facility]    2. Cough  - albuterol sulfate HFA (PROVENTIL) 108 (90 Base) MCG/ACT inhaler; Inhale 2 puffs into the lungs every 4 (four) hours as needed for Wheezing or Shortness of Breath Or cough  Dispense: 1 each; Refill: 0  - COVID-19 (SARS-CoV-2) Sendout [facility]    3. Nausea  - ondansetron (ZOFRAN) 4 MG tablet; Take 1-2 pills every 8 hours for nausea or dizziness,  Refills if needed  Dispense: 20 tablet; Refill: 1    4. Vertigo            Plan:    Sinus/Upper respiratory Illness     Glad you do not have high fevers  or shortness of breath of course now    + covid contacts then you most likely have covid 19 and do not need to be tested    5 day qurantine and then can go out and wear mask 5 days    Can use overcounter ibuprofen 200mg  3 pills every 6 hours and tylenol 500mg  1 every 6 hours for pain as needed for pain/fevers    for sinus congestion and drainage down throat    overcounter:    clartin 10mg  once per day or zyrtec 10mg  once per day or allegra 180mg  once per day  and  nasal saline rinse once per day each nostril  and  nasacort or flonase nasal steroid spray 2 sprays each nostril once per day  - Best results with being consistent and doing daily for next weeks or longer    Sent in albuterol inhaler that you can use 2 inhales every 4-6 hours and/or 1-2 hours before bed to help decrease cough/tightness  - to use please breathe out, put opening to mouth, depress top, and  then inhale hold for 7-10 seconds.  Repeat    Sent in some ondanestron for nausea if needed    Let us know if more isuses    Covid Testing for 2021-2022      In response to the increasing demand for COVID-19 testing, [facility] will open a COVID-19 vehicle-side testing center for symptomatic individuals in the community  The testing site will be located at [employer], [place], [place] and will be open Monday - Friday, 8 a.m. - 5 p.m.  - No doctor's order needed    Appointments are required for individuals who are seeking a COVID-19 test with symptoms. We are offering PCR testing only and results will be available within 1-2 days through [facility] MyChart Patient Portal. Individuals can schedule an appointment by calling [phone] between the hours of 8:00 a.m. - 6:00 p.m.       - this can be for screening for illness or just wanting to do test  - several options possible in local areas  - you must personally contact them and NO doctor order needed for most      OTHER COVID Testing or testing locations --> may or may not require doctor's orders    [email or web address] or [email or web address] --> must fill out questionaire for illness and/or exposure; may have rapid tests    [facility] --> look online; will need a doctor's order and sent today for you to print and have    [facility] --> may have rapid tests that you can do    [facility] home test:   Covid-19 Saliva PCR Test Kit, Redeemed by [facility]    [facility] at home test --> [email or web address]  - need to answer questions online  - if qualify will send you kit for self swab  - will then have results after this in 3-7 days  - NO orders from our office    [facility]   [address]  [address]  [place] [address]   [place]  Phone: [phone]  Email: [email or web address]  - no doctors ordered needed    [facility]  [email or web address]  Address: [address] [place]    email: [email or web address]     Phone: [phone]  Fax: [phone]  - schedule online  - no doctors order needed    [facility]  [address]  [address] [place] [address]  Phone:  [phone]  - No doctors orders needed    [facility]  - [email or web address]  - NO doctor's order needed from office  - Contact them directly to schedule    ALSO    You can look on [place].gov --> Middle of page "Search for Testing Near you"  --> look at the pdf of locations near you             Follow-up:    Return if symptoms worsen or fail to improve.         Maryanna Shape, DO

## 2020-06-30 NOTE — Patient Instructions (Addendum)
Sinus/Upper respiratory Illness     Glad you do not have high fevers or shortness of breath of course now    + covid contacts then you most likely have covid 19 and do not need to be tested    5 day qurantine and then can go out and wear mask 5 days    Can use overcounter ibuprofen 200mg  3 pills every 6 hours and tylenol 500mg  1 every 6 hours for pain as needed for pain/fevers    for sinus congestion and drainage down throat    overcounter:    clartin 10mg  once per day or zyrtec 10mg  once per day or allegra 180mg  once per day  and  nasal saline rinse once per day each nostril  and  nasacort or flonase nasal steroid spray 2 sprays each nostril once per day  - Best results with being consistent and doing daily for next weeks or longer    Sent in albuterol inhaler that you can use 2 inhales every 4-6 hours and/or 1-2 hours before bed to help decrease cough/tightness  - to use please breathe out, put opening to mouth, depress top, and then inhale hold for 7-10 seconds.  Repeat    Sent in some ondanestron for nausea if needed    Let us know if more isuses    Covid Testing for 2021-2022      In response to the increasing demand for COVID-19 testing, [facility] will open a COVID-19 vehicle-side testing center for symptomatic individuals in the community  The testing site will be located at [facility] [employer], [place], [place] and will be open Monday - Friday, 8 a.m. - 5 p.m.  - No doctor's order needed    Appointments are required for individuals who are seeking a COVID-19 test with symptoms. We are offering PCR testing only and results will be available within 1-2 days through [facility] MyChart Patient Portal. Individuals can schedule an appointment by calling [phone] between the hours of 8:00 a.m. - 6:00 p.m.       - this can be for screening for illness or just wanting to do test  - several options possible in local areas  - you must personally contact them and NO doctor order needed for most      OTHER COVID Testing or  testing locations --> may or may not require doctor's orders    [email or web address] or [email or web address] --> must fill out questionaire for illness and/or exposure; may have rapid tests    [facility] --> look online; will need a doctor's order and sent today for you to print and have    [facility] --> may have rapid tests that you can do    [facility] home test:   Covid-19 Saliva PCR Test Kit, Redeemed by [facility]    [facility] at home test --> [email or web address]  - need to answer questions online  - if qualify will send you kit for self swab  - will then have results after this in 3-7 days  - NO orders from our office    [facility]   [address]  [address]  [place] [address]   [place]  Phone: [phone]  Email: [email or web address]  - no doctors ordered needed    [facility]  [email or web address]  Address: [address] [place]   email: [email or web address]     Phone: [phone]  Fax: [phone]  - schedule online  - no doctors order needed    [facility]  [address]  [address] [place] [address]  Phone: [phone]  - No doctors orders needed    [facility]  - [email or web address]  - NO doctor's order needed from office  - Contact them directly to schedule    ALSO    You can look on [place] --> Middle of page "Search for Testing Near you"  --> look at the pdf of locations near you     LAB REQUISITION         Department    Name Address Phone Fax   [facility] [address]   [address]   [place] [address] [phone]      Client / Ordering Site Information: Physician Information:   Account Name: [facility]   Address 1: [address]   Address 2: [address]   [place], [place] Zip: [place], [place] [address]   Phone: [phone]    Ordering: Maryanna Shape   Degree: DO   NPI:  [id number]   UPIN:    Physician ID:          Patient Information:   Name: Leah Morgan   Gender: Female   SSN: [id number]   Patient ID: Not on file    Date of Birth: 05-03-1976 (44 years)   Phone: [phone]   Address: [address]    [place] [address]         [facility]        Order Code Tests Ordered (Total: 1)    Order Code Tests Ordered      CV2QL COVID-19 (SARS-CoV-2) [facility]out [facility]        Order Code Tests Ordered (Total: 1)    Order Code Tests Ordered      [phone] COVID-19 (SARS-CoV-2) [employer] [facility]        Order Code Tests Ordered (Total: 1)    Order Code Tests Ordered      [phone] COVID-19 (SARS-CoV-2) [facility]out [facility]             Comments:   [phone]) COVID-19 (SARS-CoV-2) [facility]out [facility]   Order Comments:   Pre-Procedure Testing: Please allow 72 hours for COVID-19 testing to be completed prior to having a scheduled procedure.          Order Questions:   Specimen Source:: Nasopharyngeal    Date of Symptom Onset: 06/28/2020      Purpose of Test:: Diagnostic -PUI    Resident in a congregate (group) care setting: No      Pregnant: Unknown    Hospitalized for COVID-19: No      Employed in healthcare setting with direct patient contact: No    Admitted to ICU for COVID-19: No      Symptomatic for COVID-19 as defined by CDC: Yes                 Order Questions:   Specimen Source:: Nasopharyngeal                 Specimen Source:   (CV2QL) COVID-19 (SARS-CoV-2) [facility]out [facility]:  Nasopharynx            Diagnosis Codes:   J06.9 R05.9         Bill Type:  Responsible Party / Guarantor Information:   Name: ANAKAREN, CAMPION   Address: [address] [place] Zip: [place], [place] [address]   Phone: [phone]   Relation to Pt: Self   Employer Name:          ABN:     Worker's Comp: N Date of Injury:             Insurance Information:   Primary Insurance: Secondary Insurance:   Ins Code: [id number]   Ins Co Name: [facility] [phone]  NON OPTIONS   Address 1: [id number]   Address 2:    [place] [address]   Policy Number: [id number]   Group #: [id number]    Ins Code:    Ins Co Name:    Address 1:    Address 2:    [place], [place] Zip:    Policy Number:    Group #:       Programmer, systems / Insured: Astronomer / Insured:   Name: ARACELLY, TENCZA   Address: [address]    [place], [place] [address]   Pt Relation to Subscriber: Spouse    Name:    Address:        Pt Relation to Subscriber:                Electronically signed by: Maryanna Shape, DO Lic # < Not on File >

## 2020-07-23 ENCOUNTER — Other Ambulatory Visit (INDEPENDENT_AMBULATORY_CARE_PROVIDER_SITE_OTHER): Payer: Self-pay | Admitting: Family Medicine

## 2020-07-23 DIAGNOSIS — R059 Cough, unspecified: Secondary | ICD-10-CM

## 2020-07-27 ENCOUNTER — Encounter (INDEPENDENT_AMBULATORY_CARE_PROVIDER_SITE_OTHER): Payer: Self-pay

## 2020-07-28 ENCOUNTER — Encounter (INDEPENDENT_AMBULATORY_CARE_PROVIDER_SITE_OTHER): Payer: Self-pay

## 2020-08-25 ENCOUNTER — Encounter (INDEPENDENT_AMBULATORY_CARE_PROVIDER_SITE_OTHER): Payer: Self-pay

## 2020-09-24 ENCOUNTER — Encounter (INDEPENDENT_AMBULATORY_CARE_PROVIDER_SITE_OTHER): Payer: Self-pay

## 2020-09-25 ENCOUNTER — Encounter (INDEPENDENT_AMBULATORY_CARE_PROVIDER_SITE_OTHER): Payer: Self-pay

## 2020-10-13 ENCOUNTER — Telehealth (INDEPENDENT_AMBULATORY_CARE_PROVIDER_SITE_OTHER): Payer: No Typology Code available for payment source | Admitting: Family Medicine

## 2020-10-13 ENCOUNTER — Encounter (INDEPENDENT_AMBULATORY_CARE_PROVIDER_SITE_OTHER): Payer: Self-pay | Admitting: Family Medicine

## 2020-10-13 DIAGNOSIS — J180 Bronchopneumonia, unspecified organism: Secondary | ICD-10-CM

## 2020-10-13 DIAGNOSIS — R059 Cough, unspecified: Secondary | ICD-10-CM

## 2020-10-13 MED ORDER — ALBUTEROL SULFATE HFA 108 (90 BASE) MCG/ACT IN AERS
2.0000 | INHALATION_SPRAY | RESPIRATORY_TRACT | 2 refills | Status: DC | PRN
Start: 2020-10-13 — End: 2020-11-01

## 2020-10-13 MED ORDER — AZITHROMYCIN 250 MG PO TABS
ORAL_TABLET | ORAL | 0 refills | Status: AC
Start: 2020-10-13 — End: 2020-10-18

## 2020-10-13 NOTE — Patient Instructions (Signed)
Continue cough syrup, albuterol,   Start azithromycin

## 2020-10-13 NOTE — Progress Notes (Signed)
Subjective:      Patient ID: Leah Morgan is a 45 y.o. female.    Chief Complaint:  Chief Complaint   Patient presents with   . Cough       HPI:  Verbal consent has been obtained from the patient to conduct this video visit encounter to minimize exposure to COVID-19. Patient was positively identified and was verified to be located in the state of [place] at the time of the visit  Patient with cough, chest congestion, nasal congestion, runny nose, for the past 5 days. Neg covid test.  No loss of taste or smell, no diarrhea, no skin rash. Has lost appetite. 2 kids have also been sick. Did have h/o covid 46 in January. Has had some fatigue, and some diff with memory since. Has tried DM for cough, minimal benefit. No fever, no chills. Having some wheezing , partially better albuterol. Needs refill.  Cough worsening at this time.       Problem List:  Patient Active Problem List   Diagnosis   . Uterine fibroid   . Family history of diabetes mellitus type II   . Hyperprolactinemia       Current Medications:  Current Outpatient Medications   Medication Sig Dispense Refill   . albuterol sulfate HFA (PROVENTIL) 108 (90 Base) MCG/ACT inhaler Inhale 2 puffs into the lungs every 4 (four) hours as needed for Wheezing or Shortness of Breath Or cough 6.7 each 2   . Ascorbic Acid (VITAMIN C) 100 MG tablet Take 100 mg by mouth daily     . azithromycin (ZITHROMAX) 250 MG tablet Two tabs PO day 1 and then one tab PO daily days 2-5 6 tablet 0   . Calcium Carbonate Antacid (ALKA-SELTZER ANTACID PO) Take by mouth     . ondansetron (ZOFRAN) 4 MG tablet Take 1-2 pills every 8 hours for nausea or dizziness,  Refills if needed 20 tablet 1   . Paragard Intrauterine Copper IUD by Intrauterine route     . VITAMIN D PO Take by mouth       No current facility-administered medications for this visit.       Allergies:  No Known Allergies    Past Medical History:  Past Medical History:   Diagnosis Date   . Seasonal allergic rhinitis         Past Surgical History:  Past Surgical History:   Procedure Laterality Date   . COLONOSCOPY  07/21/2019   . DENTAL SURGERY     . FOOT SURGERY     . RECONSTRUCTION, SEPTAL         Family History:  Family History   Problem Relation Age of Onset   . Hypertension Mother    . Hypertension Father    . Hyperlipidemia Father    . Heart disease Father    . Kidney disease Father    . Diabetes Maternal Grandmother    . Cancer Maternal Grandmother    . Glaucoma Maternal Grandmother    . Diabetes Maternal Grandfather    . Cancer Maternal Grandfather    . Diabetes Paternal Grandmother    . Kidney disease Brother    . Cancer Paternal Aunt        Social History:  Social History     Socioeconomic History   . Marital status: Married   . Number of children: 2   Tobacco Use   . Smoking status: Never Smoker   . Smokeless tobacco: Never Used  Vaping Use   . Vaping Use: Never used   Substance and Sexual Activity   . Alcohol use: Yes     Comment: 4 drinks per week   . Drug use: Never   Social History Narrative    ** Merged History Encounter **             The following sections were reviewed this encounter by the provider:          ROS:  Review of Systems   Constitutional: Positive for fatigue. Negative for activity change, chills and fever.   HENT: Positive for congestion, rhinorrhea and sore throat.    Respiratory: Positive for cough. Negative for chest tightness and shortness of breath.    Gastrointestinal: Negative for diarrhea, nausea and vomiting.   Musculoskeletal: Negative for arthralgias and myalgias.   Skin: Negative for rash.   Neurological: Negative for headaches.       Vitals:  There were no vitals taken for this visit.     Objective:     Physical Exam:  Physical Exam  Constitutional:       General: She is not in acute distress.  HENT:      Head: Normocephalic.   Eyes:      Extraocular Movements: Extraocular movements intact.   Pulmonary:      Effort: Pulmonary effort is normal.      Breath sounds: Wheezing present.    Musculoskeletal:      Cervical back: Normal range of motion.   Skin:     Findings: No rash.   Neurological:      Mental Status: She is alert and oriented to person, place, and time.   Psychiatric:         Mood and Affect: Mood normal.         Behavior: Behavior normal.         Thought Content: Thought content normal.         Judgment: Judgment normal.          Assessment:     1. Acute bronchopneumonia  - azithromycin (ZITHROMAX) 250 MG tablet; Two tabs PO day 1 and then one tab PO daily days 2-5  Dispense: 6 tablet; Refill: 0    2. Cough  - albuterol sulfate HFA (PROVENTIL) 108 (90 Base) MCG/ACT inhaler; Inhale 2 puffs into the lungs every 4 (four) hours as needed for Wheezing or Shortness of Breath Or cough  Dispense: 6.7 each; Refill: 2      Plan:   Start azithromycin, continue albuterol, DM for cough.   Total time was 30 minutes. That includes chart review before the visit, the actual patient visit, and time spent on documentation after the visit.    Jeanette Caprice, MD

## 2020-10-24 ENCOUNTER — Encounter (INDEPENDENT_AMBULATORY_CARE_PROVIDER_SITE_OTHER): Payer: Self-pay

## 2020-11-01 ENCOUNTER — Encounter (INDEPENDENT_AMBULATORY_CARE_PROVIDER_SITE_OTHER): Payer: Self-pay | Admitting: Family Medicine

## 2020-11-01 ENCOUNTER — Telehealth (INDEPENDENT_AMBULATORY_CARE_PROVIDER_SITE_OTHER): Payer: No Typology Code available for payment source | Admitting: Family Medicine

## 2020-11-01 DIAGNOSIS — J4521 Mild intermittent asthma with (acute) exacerbation: Secondary | ICD-10-CM

## 2020-11-01 DIAGNOSIS — H1010 Acute atopic conjunctivitis, unspecified eye: Secondary | ICD-10-CM | POA: Insufficient documentation

## 2020-11-01 DIAGNOSIS — J302 Other seasonal allergic rhinitis: Secondary | ICD-10-CM | POA: Insufficient documentation

## 2020-11-01 DIAGNOSIS — J0100 Acute maxillary sinusitis, unspecified: Secondary | ICD-10-CM

## 2020-11-01 DIAGNOSIS — R059 Cough, unspecified: Secondary | ICD-10-CM

## 2020-11-01 DIAGNOSIS — E221 Hyperprolactinemia: Secondary | ICD-10-CM

## 2020-11-01 DIAGNOSIS — J301 Allergic rhinitis due to pollen: Secondary | ICD-10-CM

## 2020-11-01 MED ORDER — AMOXICILLIN-POT CLAVULANATE 875-125 MG PO TABS
1.0000 | ORAL_TABLET | Freq: Two times a day (BID) | ORAL | 0 refills | Status: AC
Start: 2020-11-01 — End: 2020-11-11

## 2020-11-01 MED ORDER — ALBUTEROL SULFATE HFA 108 (90 BASE) MCG/ACT IN AERS
2.0000 | INHALATION_SPRAY | RESPIRATORY_TRACT | 5 refills | Status: DC | PRN
Start: 2020-11-01 — End: 2021-04-20

## 2020-11-01 MED ORDER — ARNUITY ELLIPTA 100 MCG/ACT IN AEPB
1.0000 | INHALATION_SPRAY | Freq: Every day | RESPIRATORY_TRACT | 5 refills | Status: AC
Start: 2020-11-01 — End: ?

## 2020-11-01 NOTE — Progress Notes (Signed)
Subjective:      Patient ID: Leah Morgan is a 45 y.o. female.    Chief Complaint:  Chief Complaint   Patient presents with   . Nasal Congestion   . Cough       HPI:  Verbal consent has been obtained from the patient to conduct this video visit encounter to minimize exposure to COVID-19. Patient was positively identified and was verified to be located in the state of [place] at the time of the visit  Patient with h/o allergies, and cough. Has been using prn albuterol inhaler, with improved. Has been living here 12 years. Patient also had covid 19 in January. Had nasal septoplasty last year. Having some swelling around eyes. Having very itchy eyes. Nasal d/c clear, yellow and green. Having occ facial pain. Cough is loose but not productive. Having intermittent wheezing. No fever, no chills. No sore throat. Is taking loratadine, nasonex, eye drops. Has h/o hyperprolactinemia, currently in remission      Problem List:  Patient Active Problem List   Diagnosis   . Uterine fibroid   . Family history of diabetes mellitus type II   . Hyperprolactinemia   . Seasonal allergic rhinitis   . Seasonal allergic conjunctivitis       Current Medications:  Current Outpatient Medications   Medication Sig Dispense Refill   . albuterol sulfate HFA (PROVENTIL) 108 (90 Base) MCG/ACT inhaler Inhale 2 puffs into the lungs every 4 (four) hours as needed for Wheezing or Shortness of Breath Or cough 6.7 each 5   . amoxicillin-clavulanate (AUGMENTIN) 875-125 MG per tablet Take 1 tablet by mouth 2 (two) times daily for 10 days 20 tablet 0   . Ascorbic Acid (VITAMIN C) 100 MG tablet Take 100 mg by mouth daily     . Calcium Carbonate Antacid (ALKA-SELTZER ANTACID PO) Take by mouth     . fluticasone furoate (Arnuity Ellipta) 100 MCG/ACT Aerosol Pwdr, Breath Activated Inhale 1 puff into the lungs daily 30 puff 5   . ondansetron (ZOFRAN) 4 MG tablet Take 1-2 pills every 8 hours for nausea or dizziness,  Refills if needed 20 tablet 1   .  Paragard Intrauterine Copper IUD by Intrauterine route     . VITAMIN D PO Take by mouth       No current facility-administered medications for this visit.       Allergies:  No Known Allergies    Past Medical History:  Past Medical History:   Diagnosis Date   . Seasonal allergic rhinitis        Past Surgical History:  Past Surgical History:   Procedure Laterality Date   . COLONOSCOPY  07/21/2019   . DENTAL SURGERY     . FOOT SURGERY     . RECONSTRUCTION, SEPTAL         Family History:  Family History   Problem Relation Age of Onset   . Hypertension Mother    . Hypertension Father    . Hyperlipidemia Father    . Heart disease Father    . Kidney disease Father    . Diabetes Maternal Grandmother    . Cancer Maternal Grandmother    . Glaucoma Maternal Grandmother    . Diabetes Maternal Grandfather    . Cancer Maternal Grandfather    . Diabetes Paternal Grandmother    . Kidney disease Brother    . Cancer Paternal Aunt        Social History:  Social History  Socioeconomic History   . Marital status: Married   . Number of children: 2   Tobacco Use   . Smoking status: Never Smoker   . Smokeless tobacco: Never Used   Vaping Use   . Vaping Use: Never used   Substance and Sexual Activity   . Alcohol use: Yes     Comment: 4 drinks per week   . Drug use: Never   Social History Narrative    ** Merged History Encounter **             The following sections were reviewed this encounter by the provider:          ROS:  Review of Systems   Constitutional: Negative for fever.   Cardiovascular: Positive for chest pain.       Vitals:  There were no vitals taken for this visit.     Objective:     Physical Exam:  Physical Exam  Constitutional:       General: She is not in acute distress.  HENT:      Head: Normocephalic.   Eyes:      Extraocular Movements: Extraocular movements intact.   Pulmonary:      Effort: Pulmonary effort is normal.      Breath sounds: No wheezing.   Musculoskeletal:      Cervical back: Normal range of motion.    Skin:     Findings: No rash.   Neurological:      Mental Status: She is alert and oriented to person, place, and time.   Psychiatric:         Mood and Affect: Mood normal.         Behavior: Behavior normal.         Thought Content: Thought content normal.         Judgment: Judgment normal.          Assessment:     1. Mild intermittent asthma with (acute) exacerbation  - fluticasone furoate (Arnuity Ellipta) 100 MCG/ACT Aerosol Pwdr, Breath Activated; Inhale 1 puff into the lungs daily  Dispense: 30 puff; Refill: 5  - albuterol sulfate HFA (PROVENTIL) 108 (90 Base) MCG/ACT inhaler; Inhale 2 puffs into the lungs every 4 (four) hours as needed for Wheezing or Shortness of Breath Or cough  Dispense: 6.7 each; Refill: 5    2. Acute non-recurrent maxillary sinusitis  - amoxicillin-clavulanate (AUGMENTIN) 875-125 MG per tablet; Take 1 tablet by mouth 2 (two) times daily for 10 days  Dispense: 20 tablet; Refill: 0    3. Cough  - albuterol sulfate HFA (PROVENTIL) 108 (90 Base) MCG/ACT inhaler; Inhale 2 puffs into the lungs every 4 (four) hours as needed for Wheezing or Shortness of Breath Or cough  Dispense: 6.7 each; Refill: 5    4. Hyperprolactinemia    5. Seasonal allergic rhinitis due to pollen    6. Seasonal allergic conjunctivitis      Plan:     Continue with current medications.  Add Arnuity Ellipta once daily to control asthma.  I advised patient she could increase Claritin to 2 tablets/day.    Total time was 30 minutes. That includes chart review before the visit, the actual patient visit, and time spent on documentation after the visit.    Jeanette Caprice, MD

## 2020-11-01 NOTE — Patient Instructions (Signed)
Continue with current over-the-counter medicines but you can increase Klor-Con to 2/day.  Start Arnuity Ellipta once daily to control and prevent the wheezing.  Continue with as needed albuterol inhaler.

## 2020-11-24 ENCOUNTER — Encounter (INDEPENDENT_AMBULATORY_CARE_PROVIDER_SITE_OTHER): Payer: Self-pay

## 2020-12-24 ENCOUNTER — Encounter (INDEPENDENT_AMBULATORY_CARE_PROVIDER_SITE_OTHER): Payer: Self-pay

## 2021-01-24 ENCOUNTER — Encounter (INDEPENDENT_AMBULATORY_CARE_PROVIDER_SITE_OTHER): Payer: Self-pay

## 2021-02-24 ENCOUNTER — Encounter (INDEPENDENT_AMBULATORY_CARE_PROVIDER_SITE_OTHER): Payer: Self-pay

## 2021-03-26 ENCOUNTER — Encounter (INDEPENDENT_AMBULATORY_CARE_PROVIDER_SITE_OTHER): Payer: Self-pay

## 2021-04-20 ENCOUNTER — Encounter (INDEPENDENT_AMBULATORY_CARE_PROVIDER_SITE_OTHER): Payer: Self-pay | Admitting: Family Medicine

## 2021-04-20 ENCOUNTER — Ambulatory Visit (INDEPENDENT_AMBULATORY_CARE_PROVIDER_SITE_OTHER): Payer: Commercial Managed Care - POS | Admitting: Family Medicine

## 2021-04-20 VITALS — BP 94/60 | HR 84 | Temp 98.0°F | Resp 16 | Ht 58.75 in | Wt 114.6 lb

## 2021-04-20 DIAGNOSIS — G8929 Other chronic pain: Secondary | ICD-10-CM | POA: Insufficient documentation

## 2021-04-20 DIAGNOSIS — Z0000 Encounter for general adult medical examination without abnormal findings: Secondary | ICD-10-CM

## 2021-04-20 DIAGNOSIS — R109 Unspecified abdominal pain: Secondary | ICD-10-CM

## 2021-04-20 DIAGNOSIS — R5382 Chronic fatigue, unspecified: Secondary | ICD-10-CM

## 2021-04-20 DIAGNOSIS — R739 Hyperglycemia, unspecified: Secondary | ICD-10-CM

## 2021-04-20 DIAGNOSIS — Z13220 Encounter for screening for lipoid disorders: Secondary | ICD-10-CM

## 2021-04-20 DIAGNOSIS — J4520 Mild intermittent asthma, uncomplicated: Secondary | ICD-10-CM | POA: Insufficient documentation

## 2021-04-20 DIAGNOSIS — E221 Hyperprolactinemia: Secondary | ICD-10-CM

## 2021-04-20 DIAGNOSIS — J300 Vasomotor rhinitis: Secondary | ICD-10-CM

## 2021-04-20 DIAGNOSIS — Z131 Encounter for screening for diabetes mellitus: Secondary | ICD-10-CM

## 2021-04-20 LAB — CBC AND DIFFERENTIAL
Absolute NRBC: 0 10*3/uL (ref 0.00–0.00)
Basophils Absolute Automated: 0.02 10*3/uL (ref 0.00–0.08)
Basophils Automated: 0.5 %
Eosinophils Absolute Automated: 0.1 10*3/uL (ref 0.00–0.44)
Eosinophils Automated: 2.6 %
Hematocrit: 43 % (ref 34.7–43.7)
Hgb: 14.1 g/dL (ref 11.4–14.8)
Immature Granulocytes Absolute: 0.01 10*3/uL (ref 0.00–0.07)
Immature Granulocytes: 0.3 %
Lymphocytes Absolute Automated: 1.19 10*3/uL (ref 0.42–3.22)
Lymphocytes Automated: 30.6 %
MCH: 30.9 pg (ref 25.1–33.5)
MCHC: 32.8 g/dL (ref 31.5–35.8)
MCV: 94.1 fL (ref 78.0–96.0)
MPV: 10.3 fL (ref 8.9–12.5)
Monocytes Absolute Automated: 0.28 10*3/uL (ref 0.21–0.85)
Monocytes: 7.2 %
Neutrophils Absolute: 2.29 10*3/uL (ref 1.10–6.33)
Neutrophils: 58.8 %
Nucleated RBC: 0 /100 WBC (ref 0.0–0.0)
Platelets: 251 10*3/uL (ref 142–346)
RBC: 4.57 10*6/uL (ref 3.90–5.10)
RDW: 14 % (ref 11–15)
WBC: 3.89 10*3/uL (ref 3.10–9.50)

## 2021-04-20 LAB — LIPID PANEL
Cholesterol / HDL Ratio: 3.9 Index
Cholesterol: 178 mg/dL (ref 0–199)
HDL: 46 mg/dL (ref 40–9999)
LDL Calculated: 114 mg/dL — ABNORMAL HIGH (ref 0–99)
Triglycerides: 92 mg/dL (ref 34–149)
VLDL Calculated: 18 mg/dL (ref 10–40)

## 2021-04-20 LAB — COMPREHENSIVE METABOLIC PANEL
ALT: 23 U/L (ref 0–55)
AST (SGOT): 19 U/L (ref 5–41)
Albumin/Globulin Ratio: 1.3 (ref 0.9–2.2)
Albumin: 4 g/dL (ref 3.5–5.0)
Alkaline Phosphatase: 73 U/L (ref 37–117)
Anion Gap: 5 (ref 5.0–15.0)
BUN: 13 mg/dL (ref 7.0–21.0)
Bilirubin, Total: 0.4 mg/dL (ref 0.2–1.2)
CO2: 30 mEq/L — ABNORMAL HIGH (ref 17–29)
Calcium: 9.5 mg/dL (ref 8.5–10.5)
Chloride: 102 mEq/L (ref 99–111)
Creatinine: 0.8 mg/dL (ref 0.4–1.0)
Globulin: 3.2 g/dL (ref 2.0–3.6)
Glucose: 92 mg/dL (ref 70–100)
Potassium: 4.3 mEq/L (ref 3.5–5.3)
Protein, Total: 7.2 g/dL (ref 6.0–8.3)
Sodium: 137 mEq/L (ref 135–145)

## 2021-04-20 LAB — GFR: EGFR: >60

## 2021-04-20 LAB — PROLACTIN: Prolactin: 11.1 ng/mL (ref 5.2–26.5)

## 2021-04-20 LAB — THYROID STIMULATING HORMONE (TSH), REFLEX ON ABNORMAL TO FREE T4, SERUM: TSH, Abn Reflex to Free T4, Serum: 1.46 u[IU]/mL (ref 0.35–4.94)

## 2021-04-20 LAB — HEMOGLOBIN A1C
Average Estimated Glucose: 114 mg/dL
Hemoglobin A1C: 5.6 % (ref 4.6–5.9)

## 2021-04-20 LAB — HEMOLYSIS INDEX: Hemolysis Index: 6 Index (ref 0–24)

## 2021-04-20 MED ORDER — ALBUTEROL SULFATE HFA 108 (90 BASE) MCG/ACT IN AERS
2.0000 | INHALATION_SPRAY | RESPIRATORY_TRACT | 5 refills | Status: DC | PRN
Start: 2021-04-20 — End: 2022-04-20

## 2021-04-20 NOTE — Progress Notes (Signed)
Subjective:      Patient ID: Leah Morgan is a 45 y.o. 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   HENT:  Positive for congestion and postnasal drip. Negative for dental problem, hearing loss, sinus pressure, sinus pain, sneezing, sore throat, tinnitus and trouble swallowing.    Eyes:  Negative for photophobia, pain and visual disturbance.   Respiratory:  Negative for apnea, cough, chest tightness, shortness of breath, wheezing (not now) and stridor.    Cardiovascular:  Negative for chest pain, palpitations and leg swelling.   Gastrointestinal:  Positive for rectal pain (on/off and not now). Negative for abdominal distention, abdominal pain (on/off), blood in stool, diarrhea, nausea and vomiting.   Endocrine: Negative for polydipsia, polyphagia and polyuria.   Genitourinary:  Negative for decreased urine volume, difficulty urinating, dysuria, enuresis, frequency, hematuria, pelvic pain and urgency.   Musculoskeletal:  Negative for arthralgias, gait problem and joint swelling.   Skin:  Negative for rash.   Allergic/Immunologic: Negative for environmental allergies and food allergies.   Neurological:  Negative for dizziness, tremors, syncope, weakness, light-headedness, numbness and headaches.   Hematological:  Negative for  adenopathy. Does not bruise/bleed easily.   Psychiatric/Behavioral:  Negative for dysphoric mood, self-injury, sleep disturbance and suicidal ideas. The patient is not nervous/anxious.         BP 94/60 (BP Site: Left arm, Patient Position: Sitting, Cuff Size: Small)   Pulse 84   Temp 98 F (36.7 C) (Temporal)   Resp 16   Ht 1.492 m (4' 10.75")   Wt 52 kg (114 lb 9.6 oz)   LMP 04/08/2021 (Exact Date)   BMI 23.34 kg/m     Objective:     Physical Exam  Vitals reviewed.   Constitutional:       Appearance: Normal appearance. She is normal weight.   HENT:      Head: Normocephalic and atraumatic.      Right Ear: Tympanic membrane, ear canal and external ear normal.      Left Ear: Tympanic membrane, ear canal and external ear normal.      Nose: Nose normal.      Mouth/Throat:      Mouth: Mucous membranes are moist.      Pharynx: Oropharynx is clear.   Eyes:      Extraocular Movements: Extraocular movements intact.      Conjunctiva/sclera: Conjunctivae normal.      Pupils: Pupils are equal, round, and reactive to light.   Cardiovascular:      Rate and Rhythm: Normal rate and  regular rhythm.      Pulses: Normal pulses.      Heart sounds: Normal heart sounds.   Pulmonary:      Effort: Pulmonary effort is normal.      Breath sounds: Normal breath sounds.   Abdominal:      General: Abdomen is flat. Bowel sounds are normal. There is no distension.      Palpations: Abdomen is soft. There is no mass.      Tenderness: There is no abdominal tenderness. There is no right CVA tenderness, left CVA tenderness, guarding or rebound.   Musculoskeletal:      Cervical back: Normal range of motion and neck supple. No rigidity or tenderness.      Right lower leg: No edema.      Left lower leg: No edema.      Comments: Feet    No swelling or rednes  Nml rom   Nml skin  Top right foot minimal prominence noted  - no redness or swelling   Lymphadenopathy:      Cervical: No cervical adenopathy.   Neurological:      General: No focal deficit  present.      Mental Status: She is alert and oriented to person, place, and time.      Cranial Nerves: No cranial nerve deficit.      Motor: No weakness.      Coordination: Coordination normal.      Gait: Gait normal.   Psychiatric:         Mood and Affect: Mood normal.         Behavior: Behavior normal.         Thought Content: Thought content normal.         Judgment: Judgment normal.        Assessment:     1. Encounter for well adult exam without abnormal findings    2. Hyperprolactinemia  - TSH, Abn Reflex to Free T4, Serum  - CBC and differential  - Prolactin    3. Screening for diabetes mellitus (DM)  - Comprehensive metabolic panel    4. Screening for lipid disorders  - Lipid panel    5. Elevated blood sugar  - Hemoglobin A1C    6. Chronic vasomotor rhinitis    7. Chronic abdominal pain  - TSH, Abn Reflex to Free T4, Serum  - CBC and differential    8. Chronic fatigue  - TSH, Abn Reflex to Free T4, Serum  - CBC and differential    9. Mild intermittent asthma with (acute) exacerbation    10. Cough    11. Mild intermittent asthma without complication  - albuterol sulfate HFA (PROVENTIL) 108 (90 Base) MCG/ACT inhaler; Inhale 2 puffs into the lungs every 4 (four) hours as needed for Wheezing or Shortness of Breath Or cough  Dispense: 6.7 each; Refill: 5      Plan:     Multtiple issues and this plan printed out     Check labs now    Daily Congestion and ear pain    Ibuprofen 200mg  3 pills every 6 hours and tylenol 500mg  1 every 6 hours  - do both for pain as needed     ALSO    for sinus congestion and drainage down throat    overcounter:    clartin 10mg  once per day or zyrtec 10mg  once per day or allegra 180mg  once per day  and  nasal saline rinse once per day each nostril  and  nasacort or flonase nasal steroid spray 2 sprays each nostril once per day  - Best results with being consistent and doing daily for next weeks or longer     Please see your ENT doctor and they may recommend allergy  testing    NEXT    Chronic abdominal issues and constipation    For abdominal bloating and pain    Make sure to drink 4-5 bottles water per day --> about 80oz of water per day    Eat dry foods, such as crackers, toast, dry cereals, or bread sticks, when you wake up and every few hours during the day. They provide nutrients and help settle your stomach. Eat cool foods instead of hot, spicy foods. Consider non-fat yogurt, fruit juice, sherbet, and sports drinks    Low FODMAP eating --> look online for other foods that produce less "gas"    Daily overcounter probiotic once per day like ALIGN or IBGUARD everyday especially if this is a long term issue    Can use overcounter pepcid AC 20mg  one pill twice a day for gas/pain if needed     To help Constipation    When you feel like you need to have a bowel movement, go to the bathroom right away.    Try to have a bowel movement right after a meal.    Try sitting on the toilet with your feet on a short footrest. This position may make it easier to have a bowel movement.    Eat more fiber. Foods that are high in fiber are whole grains, fruits, and vegetables. You can also take fiber supplements, such as Metamucil. You should try to eat 20 to 35 grams of fiber each day.    Drink more fluids. This can help the fiber to work better. Drink 4-5 bottles of water every day    Take a stool softener every day like colace    If you still have trouble having a bowel movement, take an over-the-counter laxative, such as polyethylene glycol (Miralax) once per day for 1-2 weeks and then stop.  Can repeat this cycle     See GI for more evaluation    Gastroenterology in area  - recommend to take any appointment that you can get even with NP or PA please  - see your doctor that did colonoscopy     [facility]  [email or web address]  Piedad Climes [phone]  Mackie Pai [phone]  Miles Costain [phone]    Other providers in the  area      [facility]  [email or web address]  [phone]  - many offices and locations please   - use Find a Doctor on this website to contact    [facility]  [address] , [place], [address]                                                          P: [phone] and [phone]  [email or web address]               [facility]  [address]  [place], [place] [address] [phone]  [email or web address]  [address] [place], [place] [address]  [phone]  [email or web address]    [facility] [place]  [address]  [place], [place] [address]  Phone: [phone]  Fax: [phone]  [email or web address]     NEXT    Skin concerns please see dermatology for skin check head to toe    Contact one of these dermatology offices to have skin check every 3-5 years    [facility]  [facility]  [address],  [place], [place] [address]  [phone]    [facility]  Dr. Wyman Songster  [address]  [place], [place] [address] [phone]  [email or web address]    [facility]  Dr Florentina Addison  [address]  [place], [place] [address] [phone]  [email or web address]    [facility]  Dermatology  [address], [place], [place] [address]  Ph. [phone], Fax [phone]    [facility]  Dr. Evette Cristal [address]  [place], [place] [address] [phone]  [email or web address]      [facility] [place]  Dr. Waymond Cera  [address] [place], [place] [address] [phone]  [email or web address]    [facility]  Dr. Carvel Getting Dang-Vu  [address]  [place], [place] [address] [phone] [email or web address]    [facility] [place]  [phone]   [place], [place] [address]  [email or web address]    [facility]  [address]  [place], [place] [address]  [phone]  [email or web address]     NEXT    Feet issues --> can be bone spurs and need to see podiatry    [facility]  [address]  [place], [place] [address]  [phone]  [email or web address]    or      [facility]  [address]  [place], [place] [address]  [phone]  [email or web address]       Flu shots will begin again in Sep 2022  - our office will have this and if you are in the office we will be glad to this until we are out of supply    If not then can     Go to any local pharmacy or grocery store --> you may be able to even schedule online    FOR SCHEDULING to AVOID calling the office     PLEASE USE MyChart    You can always schedule 15 minute OFFICE or VIDEO VISITS online  through MyChart on a daily basis     REMEMBER you can ONLY do video visits if you are in the state of [place] with our providers.  You cannot be in [place] and do a video visit with a provider for example.    To make an office visit or  video visit just go to "Visits" at the top and click on "Schedule an appointment" then click on a provider then reason for visit is "office visit" or "video visit" and then pick date and time and self schedule    REMEMBER for Video Visits    Our office will send you a link to to log into the [email or web address] video visit website so please wait for this and click on this on your email or cell phone  - DO NOT log into the MyChart video visit as our office currently DOES NOT use this product    ALSO  Send a message to our staff to schedule IN OFFICE physical or preop check     AND     You can write a message requesting a physical/office visit  through MyChart message provider  - Recommend if you are requesting an in office visit or physical to write the dates you are available and if you want to be seen in the AM or PM so we can  expedite the process of scheduling for you.  You can write a message requesting a physical/office visit through MyChart message provider    Also     You can google "[facility]"  and click on "Book an appointment"    Find a provider and time that fits your needs do to visit    If video visit then schedule online    If needing office visit or physical then will have to call the office at [phone] so we can check your benefits and make sure your provider is in the office      Discussion of medicines, use, and side effects as applicable  Importance of avoiding tobacco, excessive alcohol, ilicit drug use, high risk sexual behavior and sun exposure is reviewed.  Seat belt use is encouraged.  If own firearm, make sure you have taken firearm classes. Keep unloaded and safe storage is imperative  Importance of regular moderate exercise and low fat high fiber diet that includes 5 servings of fruits and vegetables a day is discussed.    Let us know if any issues     Maryanna Shape, DO

## 2021-04-20 NOTE — Patient Instructions (Addendum)
Multtiple issues and this plan printed out     Check labs now    Daily Congestion and ear pain    Ibuprofen 200mg  3 pills every 6 hours and tylenol 500mg  1 every 6 hours  - do both for pain as needed     ALSO    for sinus congestion and drainage down throat    overcounter:    clartin 10mg  once per day or zyrtec 10mg  once per day or allegra 180mg  once per day  and  nasal saline rinse once per day each nostril  and  nasacort or flonase nasal steroid spray 2 sprays each nostril once per day  - Best results with being consistent and doing daily for next weeks or longer     Please see your ENT doctor and they may recommend allergy testing    NEXT    Chronic abdominal issues and constipation    For abdominal bloating and pain    Make sure to drink 4-5 bottles water per day --> about 80oz of water per day    Eat dry foods, such as crackers, toast, dry cereals, or bread sticks, when you wake up and every few hours during the day. They provide nutrients and help settle your stomach. Eat cool foods instead of hot, spicy foods. Consider non-fat yogurt, fruit juice, sherbet, and sports drinks    Low FODMAP eating --> look online for other foods that produce less "gas"    Daily overcounter probiotic once per day like ALIGN or IBGUARD everyday especially if this is a long term issue    Can use overcounter pepcid AC 20mg  one pill twice a day for gas/pain if needed     To help Constipation    When you feel like you need to have a bowel movement, go to the bathroom right away.    Try to have a bowel movement right after a meal.    Try sitting on the toilet with your feet on a short footrest. This position may make it easier to have a bowel movement.    Eat more fiber. Foods that are high in fiber are whole grains, fruits, and vegetables. You can also take fiber supplements, such as Metamucil. You should try to eat 20 to 35 grams of fiber each day.    Drink more fluids. This can help the fiber to work better. Drink 4-5 bottles of  water every day    Take a stool softener every day like colace    If you still have trouble having a bowel movement, take an over-the-counter laxative, such as polyethylene glycol (Miralax) once per day for 1-2 weeks and then stop.  Can repeat this cycle     See GI for more evaluation    Gastroenterology in area  - recommend to take any appointment that you can get even with NP or PA please  - see your doctor that did colonoscopy      [facility]  [email or web address]  Piedad Climes [phone]  Mackie Pai [phone]  Miles Costain [phone]    Other providers in the area      [facility]  [email or web address]  [phone]  - many offices and locations please   - use Find a Doctor on this website to contact    [facility]  [address] , [place], [address]  P: [phone] and [phone]  [email or web address]               [facility]  [address]  [place], [place] [address] [phone]  [email or web address]  [address] [place], [place] [address]  [phone]  [email or web address]    [facility] [place]  [address]  [place], [place] [address]  Phone: [phone]  Fax: [phone]  [email or web address]     NEXT    Skin concerns please see dermatology for skin check head to toe    Contact one of these dermatology offices to have skin check every 3-5 years    [facility]  [facility] [place]  [address] [place],  [place], [place] [address]  [phone]    [facility]  Dr. Wyman Songster  [address]  [place], [place] [address] [phone]  [email or web address]    [facility]  Dr Florentina Addison  [address]  [place], [place] [address] [phone]  [email or web address]    [facility]  Dermatology  [address], [place], [place] [address]  Ph. [phone], Fax [phone]    [facility]  Dr. Evette Cristal [address]  [place], [place] [address] [phone]  [email or web address]      [facility] [place]  Dr. Waymond Cera  [address] [place], [place] [address] [phone]  [email or web address]    [facility]  Dr. Carvel Getting Dang-Vu  [address]  [place], [place] [address] [phone] [email or web address]    [facility] [place]  [phone]   [place], [place] [address]  [email or web address]    [facility]  [address]  [place], [place] [address]  [phone]  [email or web address]     NEXT    Feet issues --> can be bone spurs and need to see podiatry    [facility]  [address]  [place], [place] [address]  [phone]  [email or web address]    or      [facility]  [address]  [place], [place] [address]  [phone]  [email or web address]       Flu shots will begin again in Sep 2022  - our office will have this and if you are in the office we will be glad to this until we are out of supply    If not then can     Go to any local pharmacy or grocery store --> you may be able to even schedule online    FOR SCHEDULING to AVOID calling the office     PLEASE USE MyChart    You can always schedule 15 minute OFFICE or VIDEO VISITS online  through MyChart on a daily basis     REMEMBER you can ONLY do video visits if you are in the state of [place] with our providers.  You cannot be in [place] and do a video visit with a provider for example.    To make an office visit or  video visit just go to  "Visits" at the top and click on "Schedule an appointment" then click on a provider then reason for visit is "office visit" or "video visit" and then pick date and time and self schedule  REMEMBER for Video Visits    Our office will send you a link to to log into the [email or web address] video visit website so please wait for this and click on this on your email or cell phone  - DO NOT log into the MyChart video visit as our office currently DOES NOT use this product    ALSO    Send a message to our staff to schedule IN OFFICE physical or preop check     AND     You can write a message requesting a physical/office visit through MyChart message provider  - Recommend if you are requesting an in office visit or physical to write the dates you are available and if you want to be seen in the AM or PM so we can  expedite the process of scheduling for you.  You can write a message requesting a physical/office visit through MyChart message provider    Also     You can google "[facility] Medicine"  and click on "Book an appointment"    Find a provider and time that fits your needs do to visit    If video visit then schedule online    If needing office visit or physical then will have to call the office at [phone] so we can check your benefits and make sure your provider is in the office      Discussion of medicines, use, and side effects as applicable  Importance of avoiding tobacco, excessive alcohol, ilicit drug use, high risk sexual behavior and sun exposure is reviewed.  Seat belt use is encouraged.  If own firearm, make sure you have taken firearm classes. Keep unloaded and safe storage is imperative  Importance of regular moderate exercise and low fat high fiber diet that includes 5 servings of fruits and vegetables a day is discussed.    Let us know if any issues

## 2022-01-24 ENCOUNTER — Encounter (INDEPENDENT_AMBULATORY_CARE_PROVIDER_SITE_OTHER): Payer: Self-pay

## 2022-02-24 ENCOUNTER — Encounter (INDEPENDENT_AMBULATORY_CARE_PROVIDER_SITE_OTHER): Payer: Self-pay

## 2022-03-26 ENCOUNTER — Encounter (INDEPENDENT_AMBULATORY_CARE_PROVIDER_SITE_OTHER): Payer: Self-pay

## 2022-04-26 ENCOUNTER — Encounter (INDEPENDENT_AMBULATORY_CARE_PROVIDER_SITE_OTHER): Payer: Self-pay

## 2022-05-16 ENCOUNTER — Encounter (INDEPENDENT_AMBULATORY_CARE_PROVIDER_SITE_OTHER): Payer: Commercial Managed Care - POS | Admitting: Family Medicine

## 2022-05-26 ENCOUNTER — Encounter (INDEPENDENT_AMBULATORY_CARE_PROVIDER_SITE_OTHER): Payer: Self-pay

## 2022-06-26 ENCOUNTER — Encounter (INDEPENDENT_AMBULATORY_CARE_PROVIDER_SITE_OTHER): Payer: Self-pay

## 2022-06-27 ENCOUNTER — Encounter (INDEPENDENT_AMBULATORY_CARE_PROVIDER_SITE_OTHER): Payer: No Typology Code available for payment source | Admitting: Family Medicine

## 2022-07-25 ENCOUNTER — Ambulatory Visit (INDEPENDENT_AMBULATORY_CARE_PROVIDER_SITE_OTHER): Payer: No Typology Code available for payment source | Admitting: Family Medicine

## 2022-07-25 ENCOUNTER — Encounter (INDEPENDENT_AMBULATORY_CARE_PROVIDER_SITE_OTHER): Payer: Self-pay | Admitting: Family Medicine

## 2022-07-25 VITALS — BP 90/58 | HR 60 | Temp 97.0°F | Resp 16 | Ht 58.3 in | Wt 116.0 lb

## 2022-07-25 DIAGNOSIS — Z1159 Encounter for screening for other viral diseases: Secondary | ICD-10-CM

## 2022-07-25 DIAGNOSIS — Z833 Family history of diabetes mellitus: Secondary | ICD-10-CM

## 2022-07-25 DIAGNOSIS — Z0000 Encounter for general adult medical examination without abnormal findings: Secondary | ICD-10-CM

## 2022-07-25 DIAGNOSIS — E221 Hyperprolactinemia: Secondary | ICD-10-CM

## 2022-07-25 DIAGNOSIS — E7800 Pure hypercholesterolemia, unspecified: Secondary | ICD-10-CM

## 2022-07-25 LAB — CBC AND DIFFERENTIAL
Absolute NRBC: 0 10*3/uL (ref 0.00–0.00)
Basophils Absolute Automated: 0.04 10*3/uL (ref 0.00–0.08)
Basophils Automated: 0.9 %
Eosinophils Absolute Automated: 0.07 10*3/uL (ref 0.00–0.44)
Eosinophils Automated: 1.5 %
Hematocrit: 42.8 % (ref 34.7–43.7)
Hgb: 14.2 g/dL (ref 11.4–14.8)
Immature Granulocytes Absolute: 0.01 10*3/uL (ref 0.00–0.07)
Immature Granulocytes: 0.2 %
Instrument Absolute Neutrophil Count: 2.63 10*3/uL (ref 1.10–6.33)
Lymphocytes Absolute Automated: 1.55 10*3/uL (ref 0.42–3.22)
Lymphocytes Automated: 33.9 %
MCH: 30.4 pg (ref 25.1–33.5)
MCHC: 33.2 g/dL (ref 31.5–35.8)
MCV: 91.6 fL (ref 78.0–96.0)
MPV: 10.6 fL (ref 8.9–12.5)
Monocytes Absolute Automated: 0.27 10*3/uL (ref 0.21–0.85)
Monocytes: 5.9 %
Neutrophils Absolute: 2.63 10*3/uL (ref 1.10–6.33)
Neutrophils: 57.6 %
Nucleated RBC: 0 /100 WBC (ref 0.0–0.0)
Platelets: 223 10*3/uL (ref 142–346)
RBC: 4.67 10*6/uL (ref 3.90–5.10)
RDW: 14 % (ref 11–15)
WBC: 4.57 10*3/uL (ref 3.10–9.50)

## 2022-07-25 LAB — HEMOGLOBIN A1C
Average Estimated Glucose: 114 mg/dL
Hemoglobin A1C: 5.6 % (ref 4.6–5.6)

## 2022-07-25 NOTE — Patient Instructions (Signed)
Push aerobic exercise.   Consider updated covid and flu vaccine.

## 2022-07-25 NOTE — Progress Notes (Signed)
[place] [facility]                       Date of Exam: 07/25/2022 10:10 PM        Patient ID: Leah Morgan is a 47 y.o. female.  Attending Physician: Nida Boatman, MD        Chief Complaint:    Chief Complaint   Patient presents with    Annual Exam     Fasting : yes  Mammo : 2023 normal  Pap : 07/12/2022 waiting for result w/OB  Colonoscopy: 07/21/2019                 HPI:    HPI  Visit Type: Health Maintenance Visit  Work Status: working full-time  Reported Health: excellent health  Reported Diet:  healthy  Reported Exercise: walking 30-60 minutes/day  Dental: regular dental visits twice a year  Vision: regular eye exams   Hearing: normal hearing  Immunization Status:  due for flu shot and covid vaccine  Reproductive Health: sexually active  Prior Screening Tests: pap smear < 1 year ago , colonoscopy 2021, normal   General Health Risks: no family history of prostate cancer, family history of colon cancer, and no family history of breast cancer  Safety Elements Used: uses seat belts, smoke detectors in household, and carbon monoxide detectors in household   Has FH DM and also has personal h/o hyperprolacinemia , since resolved, due for recheck.          Problem List:    Patient Active Problem List   Diagnosis    Uterine fibroid    Family history of diabetes mellitus type II    Seasonal allergic rhinitis    Seasonal allergic conjunctivitis    Chronic abdominal pain    Chronic vasomotor rhinitis    Mild intermittent asthma without complication             Current Meds:    Outpatient Medications Marked as Taking for the 07/25/22 encounter (Office Visit) with Nida Boatman, MD   Medication Sig Dispense Refill    Ascorbic Acid (VITAMIN C) 100 MG tablet Take 1 tablet (100 mg) by mouth daily      fluticasone furoate (Arnuity Ellipta) 100 MCG/ACT Aerosol Pwdr, Breath Activated Inhale 1 puff into the lungs daily 30 puff 5    Paragard Intrauterine Copper IUD by  Intrauterine route      VITAMIN D PO Take by mouth            Allergies:    No Known Allergies          Past Surgical History:    Past Surgical History:   Procedure Laterality Date    COLONOSCOPY  07/21/2019    DENTAL SURGERY      DENTAL SURGERY  2021    FOOT SURGERY      [place]  2021    RECONSTRUCTION, SEPTAL             Family History:    Family History   Problem Relation Age of Onset    Hypertension Mother     Hypertension Father     Hyperlipidemia Father     Heart disease Father     Kidney disease Father     Kidney disease Brother     Cancer Paternal Aunt     Diabetes Maternal Grandmother     Cancer Maternal Grandmother  Glaucoma Maternal Grandmother     Diabetes Maternal Grandfather     Cancer Maternal Grandfather     Diabetes Paternal Grandmother            Social History:    Social History     Tobacco Use    Smoking status: Never    Smokeless tobacco: Never   Vaping Use    Vaping Use: Never used   Substance Use Topics    Alcohol use: Yes     Comment: 4 drinks per week    Drug use: Never           The following sections were reviewed this encounter by the provider:   Tobacco  Soc Hx            Vital Signs:    BP 90/58 (BP Site: Right arm, Patient Position: Sitting, Cuff Size: Medium)   Pulse 60   Temp 97 F (36.1 C) (Temporal)   Resp 16   Ht 1.481 m (4' 10.3")   Wt 52.6 kg (116 lb)   LMP 07/02/2022 (Exact Date)   BMI 24.00 kg/m          ROS:    Review of Systems   Constitutional:  Negative for activity change, appetite change, fatigue and unexpected weight change.   HENT:  Negative for congestion, hearing loss and sore throat.    Eyes:  Negative for redness and visual disturbance.   Respiratory:  Negative for cough, chest tightness, shortness of breath and wheezing.    Cardiovascular:  Negative for chest pain, palpitations and leg swelling.   Gastrointestinal:  Negative for abdominal distention, abdominal pain, anal bleeding, blood in stool, constipation, diarrhea, nausea and vomiting.   Endocrine:  Negative for cold intolerance, heat intolerance, polydipsia and polyuria.   Genitourinary:  Negative for difficulty urinating, frequency and menstrual problem.   Musculoskeletal:  Negative for arthralgias and myalgias.   Neurological:  Negative for dizziness, weakness, numbness and headaches.   Hematological:  Negative for adenopathy.   Psychiatric/Behavioral:  Negative for dysphoric mood and sleep disturbance.       General/Constitutional:   Denies Change in appetite. Denies Chills. Denies Fatigue. Denies Fever.   Ophthalmologic:   Denies Blurred vision. Denies Eye Discharge. Denies Eye Pain.   ENT:   Denies Nasal Discharge. Denies Hoarseness. Denies Ear pain. Denies Nosebleed. Denies Sinus pain. Denies Sore throat.   Endocrine:   Denies Decreased Libido. Denies Polydipsia. Denies Polyuria. Denies Weakness.   Respiratory:   Denies Paroxysmal Nocturnal Dyspnea. Denies Cough. Denies Orthopnea. Denies Shortness of breath. Denies Daytime Hypersomnolence. Denies Snoring. Denies Witness Apnea. Denies Wheezing.   Cardiovascular:   Denies Chest pain. Denies Chest pain with exertion. Denies Leg Claudication. Denies Palpitations. Denies Swelling in hands/feet.   Gastrointestinal:   Denies Abdominal pain. Denies Blood in stool. Denies Constipation. Denies Diarrhea. Denies Heartburn. Denies Nausea. Denies Vomiting.   Hematology:   Denies Easy bruising. Denies Easy Bleeding. Denies Swollen glands.   Genitourinary:   Denies Blood in urine. Denies Nocturia. Denies Painful urination.   Musculoskeletal:   Denies Joint pain. Denies Joint stiffness. Denies Leg cramps. Denies Muscle aches. Denies Weakness in LE. Denies Swollen joints. Denies Weakness in UE.   Skin:   Denies Itching. Denies Change in Mole(s). Denies Rash.   Neurologic:   Denies Balance difficulty. Denies Dizziness. Denies Gait abnormality. Denies Headache. Denies Pre-Syncope. Denies Memory loss. Denies Seizures. Denies Tingling/Numbness.   Psychiatric:   Denies  Anxiety. Denies Depressed mood. Denies  Difficulty sleeping.           Physical Exam:   HEENT normal  Thyroid normal,   No adenopathy   Chest clear  CVS pulse reg, S1 S2 normal, no gallop, no murmurm   Abd normal.   Extr normal   Skin normal.   Neuro normal.    Assessment:    1. Well adult exam  - CBC and differential  - Comprehensive metabolic panel  - Lipid panel    2. Family history of diabetes mellitus type II  - Hemoglobin A1C    3. Hyperprolactinemia  - Prolactin    4. Pure hypercholesterolemia  - TSH    5. Need for hepatitis C screening test  - Hepatitis C (HCV) antibody, Total            Plan:    Health Maintenance:  Recommend obtaining a seasonal Influenza vaccination.  Vision screening UTD. Dental Screening UTD. Colonoscopy is UTD. Mammogram screening is UTD. Cervical cancer screening is UTD.         Advised flu shot and updated covid vaccine.    Follow-up:    No follow-ups on file.         Nida Boatman, MD

## 2022-07-26 LAB — COMPREHENSIVE METABOLIC PANEL
ALT: 20 U/L (ref 0–55)
AST (SGOT): 18 U/L (ref 5–41)
Albumin/Globulin Ratio: 1.4 (ref 0.9–2.2)
Albumin: 4.3 g/dL (ref 3.5–5.0)
Alkaline Phosphatase: 72 U/L (ref 37–117)
Anion Gap: 10 (ref 5.0–15.0)
BUN: 14 mg/dL (ref 7.0–21.0)
Bilirubin, Total: 0.5 mg/dL (ref 0.2–1.2)
CO2: 23 mEq/L (ref 17–29)
Calcium: 9.4 mg/dL (ref 8.5–10.5)
Chloride: 107 mEq/L (ref 99–111)
Creatinine: 0.7 mg/dL (ref 0.4–1.0)
Globulin: 3 g/dL (ref 2.0–3.6)
Glucose: 89 mg/dL (ref 70–100)
Potassium: 4 mEq/L (ref 3.5–5.3)
Protein, Total: 7.3 g/dL (ref 6.0–8.3)
Sodium: 140 mEq/L (ref 135–145)
eGFR: >60 mL/min/{1.73_m2} (ref >=60)

## 2022-07-26 LAB — LIPID PANEL
Cholesterol / HDL Ratio: 3 Index
Cholesterol: 155 mg/dL (ref 0–199)
HDL: 51 mg/dL (ref 40–9999)
LDL Calculated: 92 mg/dL (ref 0–99)
Triglycerides: 61 mg/dL (ref 34–149)
VLDL Calculated: 12 mg/dL (ref 10–40)

## 2022-07-26 LAB — PROLACTIN: Prolactin: 12.6 ng/mL (ref 5.2–26.5)

## 2022-07-26 LAB — HEMOLYSIS INDEX: Hemolysis Index: 3 Index (ref 0–24)

## 2022-07-26 LAB — TSH: TSH: 1.88 u[IU]/mL (ref 0.35–4.94)

## 2022-07-26 LAB — HEPATITIS C ANTIBODY: Hepatitis C, AB: NONREACTIVE

## 2022-07-27 ENCOUNTER — Encounter (INDEPENDENT_AMBULATORY_CARE_PROVIDER_SITE_OTHER): Payer: Self-pay

## 2022-08-25 ENCOUNTER — Encounter (INDEPENDENT_AMBULATORY_CARE_PROVIDER_SITE_OTHER): Payer: Self-pay

## 2022-09-25 ENCOUNTER — Encounter (INDEPENDENT_AMBULATORY_CARE_PROVIDER_SITE_OTHER): Payer: Self-pay

## 2022-10-25 ENCOUNTER — Encounter (INDEPENDENT_AMBULATORY_CARE_PROVIDER_SITE_OTHER): Payer: Self-pay

## 2022-11-17 ENCOUNTER — Encounter (INDEPENDENT_AMBULATORY_CARE_PROVIDER_SITE_OTHER): Payer: Self-pay | Admitting: Family Medicine

## 2022-11-17 ENCOUNTER — Ambulatory Visit (INDEPENDENT_AMBULATORY_CARE_PROVIDER_SITE_OTHER): Payer: No Typology Code available for payment source | Admitting: Family Medicine

## 2022-11-17 VITALS — BP 100/62 | HR 76 | Temp 97.5°F | Resp 16 | Ht 59.25 in | Wt 114.9 lb

## 2022-11-17 DIAGNOSIS — Z8619 Personal history of other infectious and parasitic diseases: Secondary | ICD-10-CM

## 2022-11-17 DIAGNOSIS — R1013 Epigastric pain: Secondary | ICD-10-CM

## 2022-11-17 NOTE — Progress Notes (Signed)
Subjective:      Patient ID: Leah Morgan is a 47 y.o. female     Chief Complaint   Patient presents with    GI Problem     2 weeks ago - right lower abdominal pain & more frequent bowel movements    Now more generalized pain        OV    Pain RUG and epigastric for several weeks    Pt travelled to [place]    No bowel or bladder issues    Able to eat and drink    Pt ate and drank in the last 1-2 hours    Normal stools    No vomitting or fevers    No pain now    Nml menses    Nml urinaton    Tried some otc medicines          The following sections were reviewed this encounter by the provider:        Review of Systems   Constitutional:  Negative for activity change, appetite change, fatigue, fever and unexpected weight change.   HENT:  Negative for congestion, dental problem, hearing loss, postnasal drip, sore throat, tinnitus and trouble swallowing.    Eyes:  Negative for photophobia, pain and visual disturbance.   Respiratory:  Negative for apnea, cough, chest tightness and shortness of breath.    Cardiovascular:  Negative for chest pain, palpitations and leg swelling.   Gastrointestinal:  Positive for abdominal pain. Negative for abdominal distention, anal bleeding, blood in stool, constipation, diarrhea, nausea, rectal pain and vomiting.   Endocrine: Negative for polydipsia, polyphagia and polyuria.   Genitourinary:  Negative for decreased urine volume, difficulty urinating, dysuria, enuresis, frequency, hematuria, urgency and vaginal bleeding.   Musculoskeletal:  Negative for arthralgias and gait problem.   Skin:  Negative for rash.   Allergic/Immunologic: Negative for environmental allergies and food allergies.   Neurological:  Negative for dizziness, tremors, syncope, weakness, light-headedness, numbness and headaches.   Hematological:  Negative for adenopathy. Does not bruise/bleed easily.   Psychiatric/Behavioral:  Negative for dysphoric mood, self-injury, sleep disturbance and suicidal ideas.  The patient is not nervous/anxious.           BP 100/62 (BP Site: Right arm, Patient Position: Sitting, Cuff Size: Medium)   Pulse 76   Temp 97.5 F (36.4 C) (Temporal)   Resp 16   Ht 1.505 m (4' 11.25")   Wt 52.1 kg (114 lb 14.4 oz)   BMI 23.01 kg/m     Objective:     Physical Exam  Constitutional:       General: Leah Morgan is not in acute distress.     Appearance: Normal appearance. Leah Morgan is not ill-appearing, toxic-appearing or diaphoretic.   HENT:      Head: Normocephalic and atraumatic.      Nose: Nose normal.   Eyes:      Extraocular Movements: Extraocular movements intact.      Conjunctiva/sclera: Conjunctivae normal.      Pupils: Pupils are equal, round, and reactive to light.   Cardiovascular:      Pulses: Normal pulses.      Heart sounds: Normal heart sounds. No murmur heard.  Pulmonary:      Effort: Pulmonary effort is normal. No respiratory distress.      Breath sounds: Normal breath sounds. No stridor. No wheezing, rhonchi or rales.   Chest:      Chest wall: No tenderness.   Abdominal:  General: Abdomen is flat. Bowel sounds are normal. There is no distension.      Palpations: Abdomen is soft. There is no mass.      Tenderness: There is no abdominal tenderness (not today but epi and ruq area noted). There is no right CVA tenderness, left CVA tenderness, guarding or rebound.      Hernia: No hernia is present.   Musculoskeletal:      Cervical back: Normal range of motion and neck supple. No rigidity or tenderness.   Lymphadenopathy:      Cervical: No cervical adenopathy.   Neurological:      General: No focal deficit present.      Mental Status: Leah Morgan is alert and oriented to person, place, and time.   Psychiatric:         Mood and Affect: Mood normal.         Behavior: Behavior normal.         Thought Content: Thought content normal.         Judgment: Judgment normal.          Assessment:     1. Epigastric abdominal pain  - US Abdomen Complete  - H. pylori breath test; Future    2. Dyspepsia    3.  History of Helicobacter infection        Plan:     Printed this plan now    Glad no fevers or bowel or bladder issues    Recommend    For abdominal bloating and pain    Make sure to drink 4-5 bottles water per day --> about 80oz of water per day    When having issues    Eat dry foods, such as crackers, toast, dry cereals, or bread sticks, when you wake up and every few hours during the day. They provide nutrients and help settle your stomach. Eat cool foods instead of hot, spicy foods. Consider non-fat yogurt, fruit juice, sherbet, and sports drinks    Low FODMAP eating --> look online for other foods that produce less "gas"    Daily overcounter probiotic once per day like ALIGN or IBGUARD everyday especially if this is a long term issue    Should use overcounter pepcid AC 20mg  one pill twice a day for gas/pain if needed     ALSO    Need to do     H Pylori Breath Test --> H Pylori bacteria can cause stomach pains and even ulcers    You should NOT be taking antibiotics, pepto bismol, prilosec/prevacid/similar medications within two weeks prior to testing    Schedule at [facility]    For [place]  Lab Orders --> NO PAPERS NEEDED    Need to fast for at least one hour prior to testing.     Appointments are Encouraged to Reduce Your Wait Time    Sign into your MyChart account to make a LAB appointment    Once signed into MyChart follow these steps:    Choose Menu (upper left corner of the screen)  Select - Make an appointment  Select - [facility] Visit    ALSO    Call and schedule abdominal ultrasound to look at gallbladder/liver etc     [facility]   Scheduling  [phone]   - ask to be seen at a convenient location for yourself     NOW     More pain or swelling or vomitting then ER please    Venetia Night  Odis Luster, DO

## 2022-11-17 NOTE — Patient Instructions (Addendum)
Printed this plan now    Glad no fevers or bowel or bladder issues    Recommend    For abdominal bloating and pain    Make sure to drink 4-5 bottles water per day --> about 80oz of water per day    When having issues    Eat dry foods, such as crackers, toast, dry cereals, or bread sticks, when you wake up and every few hours during the day. They provide nutrients and help settle your stomach. Eat cool foods instead of hot, spicy foods. Consider non-fat yogurt, fruit juice, sherbet, and sports drinks    Low FODMAP eating --> look online for other foods that produce less "gas"    Daily overcounter probiotic once per day like ALIGN or IBGUARD everyday especially if this is a long term issue    Should use overcounter pepcid AC 20mg  one pill twice a day for gas/pain if needed     ALSO    Need to do     H Pylori Breath Test --> H Pylori bacteria can cause stomach pains and even ulcers    You should NOT be taking antibiotics, pepto bismol, prilosec/prevacid/similar medications within two weeks prior to testing    Schedule at [facility]    For [facility]  Lab Orders --> NO PAPERS NEEDED    Need to fast for at least one hour prior to testing.     Appointments are Encouraged to Reduce Your Wait Time    Sign into your MyChart account to make a LAB appointment    Once signed into MyChart follow these steps:    Choose Menu (upper left corner of the screen)  Select - Make an appointment  Select - [facility] Visit    ALSO    Call and schedule abdominal ultrasound to look at gallbladder/liver etc     [facility]   Scheduling  [phone]   - ask to be seen at a convenient location for yourself     NOW     More pain or swelling or vomitting then ER please

## 2022-11-22 ENCOUNTER — Other Ambulatory Visit: Payer: No Typology Code available for payment source

## 2022-11-25 ENCOUNTER — Encounter (INDEPENDENT_AMBULATORY_CARE_PROVIDER_SITE_OTHER): Payer: Self-pay

## 2022-12-11 ENCOUNTER — Other Ambulatory Visit (FREE_STANDING_LABORATORY_FACILITY): Payer: No Typology Code available for payment source

## 2022-12-11 ENCOUNTER — Other Ambulatory Visit: Payer: No Typology Code available for payment source

## 2022-12-11 DIAGNOSIS — R1013 Epigastric pain: Secondary | ICD-10-CM

## 2022-12-14 LAB — HELICOBACTER PYLORI UREA BREATH TEST: H. pylori Breath Test: NOT DETECTED

## 2022-12-25 ENCOUNTER — Encounter (INDEPENDENT_AMBULATORY_CARE_PROVIDER_SITE_OTHER): Payer: Self-pay

## 2023-01-25 ENCOUNTER — Encounter (INDEPENDENT_AMBULATORY_CARE_PROVIDER_SITE_OTHER): Payer: Self-pay

## 2023-02-21 ENCOUNTER — Telehealth (INDEPENDENT_AMBULATORY_CARE_PROVIDER_SITE_OTHER): Payer: No Typology Code available for payment source | Admitting: Family Medicine

## 2023-02-21 ENCOUNTER — Encounter (INDEPENDENT_AMBULATORY_CARE_PROVIDER_SITE_OTHER): Payer: Self-pay | Admitting: Family Medicine

## 2023-02-21 DIAGNOSIS — S86812S Strain of other muscle(s) and tendon(s) at lower leg level, left leg, sequela: Secondary | ICD-10-CM

## 2023-02-21 DIAGNOSIS — M79662 Pain in left lower leg: Secondary | ICD-10-CM

## 2023-02-21 NOTE — Patient Instructions (Signed)
Glad to hear that ultrasound of calf was normal and no blood clot    Calf strain and with brusing then means usually muscle pain/tear    Ibuprofen 200mg  3 pills every 6 hours and tylenol 500mg  1 every 6 hours  - do both for pain as needed     Ice and heat     Looking calf exercises online    ALSO    More issues then see orthopedics  - do you need to have a cam boot and physical therapy    Here is a good contact    [facility]    Website for information --> [email or web address]    [facility] Location  [address]  [address]  [place], [place] [address]  [phone]    Monday-Friday 8 a.m.-8 p.m.  Saturday 9 a.m.-5 p.m.

## 2023-02-21 NOTE — Progress Notes (Signed)
[facility] [place] [facility]                       Date of Virtual Visit: 02/21/2023 9:47 PM        Patient ID: Leah Morgan is a 47 y.o. female.  Attending Physician: Maryanna Shape, DO       Telemedicine Eligibility:    State Location:  [x]  [place]  []  [place]  []  [place] []  [place]  []  Other:    Physical Location:  [x]  Home  []         []        []          []  Other:    Patient Identity Verification:  [x]  State Issued ID  []  Insurance Eligibility Check  []  Other:    Physical Address Verification: (for 911)  [x]  Yes  []  No    Personal identity shared with patient:  [x]  Yes  []  No    Education on nature of video visit shared with patient:  [x]  Yes  []  No    Emergency plan agreed upon with patient:  [x]  Yes  []  No    If the patient had not had this virtual visit, what would they have done?  []         []         []        []          []  Other:    Visit terminated since not appropriate for virtual care:  [x]  N/A  []  Reason:         Chief Complaint:    Chief Complaint   Patient presents with    Follow-up               HPI:    Video Visit today      This visit is being conducted via video today per patient agreement    Total time of visit was 30+ minutes for chart review before appointment, updating chart, actual patient visit, and time spent creating assesmment and plan along with MDM is coded as  99214    ER at [facility] --> review of notes on iphone pt keeper    Pt had pain in left lower leg cramping and calf pain    Pt went to ER and had [place] and this was normal     Pt comments that pain and comes and goes    Pt is a bit better now    Pt is showing bruise on camera over calf    Pt is using ibuprofen and muscle relaxer    No other brusiing on body    Pt feels well otherwise     No achilles or foot issues                Problem List:    Problem List[1]          Current Meds:    Medications Taking[2]       Allergies:    Allergies[3]          Past Surgical History:     Past Surgical History:   Procedure Laterality Date    COLONOSCOPY, DIAGNOSTIC (SCREENING)  07/21/2019    DENTAL SURGERY      DENTAL SURGERY  2021    FOOT SURGERY      [place]  2021    RECONSTRUCTION, SEPTAL  Family History:    Family History   Problem Relation Age of Onset    Hypertension Mother     Hypertension Father     Hyperlipidemia Father     Heart disease Father     Kidney disease Father     Kidney disease Brother     Cancer Paternal Aunt     Diabetes Maternal Grandmother     Cancer Maternal Grandmother     Glaucoma Maternal Grandmother     Diabetes Maternal Grandfather     Cancer Maternal Grandfather     Diabetes Paternal Grandmother            Social History:    Social History[4]        The following sections were reviewed this encounter by the provider:            Vital Signs:    There were no vitals taken for this visit.         ROS:    Review of Systems   Constitutional:  Negative for activity change, appetite change, fatigue, fever and unexpected weight change.   HENT:  Negative for congestion, dental problem, hearing loss, postnasal drip, sore throat, tinnitus and trouble swallowing.    Eyes:  Negative for photophobia, pain and visual disturbance.   Respiratory:  Negative for apnea, cough, chest tightness and shortness of breath.    Cardiovascular:  Negative for chest pain, palpitations and leg swelling.   Gastrointestinal:  Negative for abdominal pain, blood in stool, diarrhea, nausea and vomiting.   Endocrine: Negative for polydipsia, polyphagia and polyuria.   Genitourinary:  Negative for decreased urine volume, difficulty urinating, dysuria, enuresis, frequency, hematuria and urgency.   Musculoskeletal:  Positive for arthralgias and gait problem. Negative for back pain and joint swelling.   Skin:  Negative for rash.   Allergic/Immunologic: Negative for environmental allergies and food allergies.   Neurological:  Negative for dizziness, tremors, syncope, weakness, light-headedness,  numbness and headaches.   Hematological:  Negative for adenopathy. Does not bruise/bleed easily.   Psychiatric/Behavioral:  Negative for dysphoric mood, self-injury, sleep disturbance and suicidal ideas. The patient is not nervous/anxious.               Physical Exam:    Physical Exam  Musculoskeletal:      Comments: On camera    Can see bruise over mid left calf        GENERAL APPEARANCE: alert, in no acute distress, pleasant, well nourished.   HEAD: normal appearance  EYES: no discharge  EARS: normal hearing  NECK/THYROID: appearance -supple  PSYCH: appropriate affect, appropriate mood, normal speech, normal attention        Assessment:    1. Pain of left calf    2. Strain of calf muscle, left, sequela            Plan:    Glad to hear that ultrasound of calf was normal and no blood clot    Calf strain and with brusing then means usually muscle pain/tear    Ibuprofen 200mg  3 pills every 6 hours and tylenol 500mg  1 every 6 hours  - do both for pain as needed     Ice and heat     Looking calf exercises online    ALSO    More issues then see orthopedics  - do you need to have a cam boot and physical therapy    Here is a good contact    [facility]    Website for information --> [email or web address]    [facility]  [address]  [address]  [place], [place] [address]  [phone]    Monday-Friday 8 a.m.-8 p.m.  Saturday 9 a.m.-5 p.m.            Follow-up:    Return if symptoms worsen or fail to improve.         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   [3] No Known Allergies  [4]   Social History  Tobacco Use    Smoking status: Never    Smokeless tobacco: Never   Vaping Use    Vaping status: Never Used   Substance Use Topics    Alcohol use: Yes     Comment: 4 drinks per week    Drug use: Never

## 2023-02-25 ENCOUNTER — Encounter (INDEPENDENT_AMBULATORY_CARE_PROVIDER_SITE_OTHER): Payer: Self-pay

## 2023-03-12 ENCOUNTER — Encounter (INDEPENDENT_AMBULATORY_CARE_PROVIDER_SITE_OTHER): Payer: Self-pay

## 2023-03-27 ENCOUNTER — Encounter (INDEPENDENT_AMBULATORY_CARE_PROVIDER_SITE_OTHER): Payer: Self-pay

## 2023-04-20 HISTORY — PX: COLONOSCOPY W/ BIOPSIES: SHX1374

## 2023-04-20 HISTORY — PX: EGD: SHX3789

## 2023-04-27 ENCOUNTER — Encounter (INDEPENDENT_AMBULATORY_CARE_PROVIDER_SITE_OTHER): Payer: Self-pay

## 2023-05-15 ENCOUNTER — Other Ambulatory Visit (FREE_STANDING_LABORATORY_FACILITY): Payer: No Typology Code available for payment source | Admitting: Gastroenterology

## 2023-05-15 DIAGNOSIS — K3189 Other diseases of stomach and duodenum: Secondary | ICD-10-CM

## 2023-05-15 DIAGNOSIS — K2289 Other specified disease of esophagus: Secondary | ICD-10-CM

## 2023-05-15 DIAGNOSIS — K219 Gastro-esophageal reflux disease without esophagitis: Secondary | ICD-10-CM

## 2023-05-15 DIAGNOSIS — Z1211 Encounter for screening for malignant neoplasm of colon: Secondary | ICD-10-CM

## 2023-05-23 LAB — SURGICAL PATHOLOGY

## 2023-05-27 ENCOUNTER — Encounter (INDEPENDENT_AMBULATORY_CARE_PROVIDER_SITE_OTHER): Payer: Self-pay

## 2023-06-27 ENCOUNTER — Encounter (INDEPENDENT_AMBULATORY_CARE_PROVIDER_SITE_OTHER): Payer: Self-pay

## 2023-06-28 ENCOUNTER — Encounter (INDEPENDENT_AMBULATORY_CARE_PROVIDER_SITE_OTHER): Payer: Self-pay | Admitting: Family Medicine

## 2023-07-12 ENCOUNTER — Encounter (INDEPENDENT_AMBULATORY_CARE_PROVIDER_SITE_OTHER): Payer: No Typology Code available for payment source | Admitting: Family Medicine

## 2023-07-13 ENCOUNTER — Ambulatory Visit (INDEPENDENT_AMBULATORY_CARE_PROVIDER_SITE_OTHER): Payer: No Typology Code available for payment source | Admitting: Family Medicine

## 2023-07-13 ENCOUNTER — Encounter (INDEPENDENT_AMBULATORY_CARE_PROVIDER_SITE_OTHER): Payer: Self-pay | Admitting: Family Medicine

## 2023-07-13 VITALS — BP 110/60 | HR 80 | Temp 98.1°F | Resp 16 | Ht 59.0 in | Wt 117.1 lb

## 2023-07-13 DIAGNOSIS — Z0000 Encounter for general adult medical examination without abnormal findings: Secondary | ICD-10-CM

## 2023-07-13 DIAGNOSIS — R29818 Other symptoms and signs involving the nervous system: Secondary | ICD-10-CM | POA: Insufficient documentation

## 2023-07-13 DIAGNOSIS — Z131 Encounter for screening for diabetes mellitus: Secondary | ICD-10-CM

## 2023-07-13 DIAGNOSIS — R5382 Chronic fatigue, unspecified: Secondary | ICD-10-CM

## 2023-07-13 DIAGNOSIS — R7989 Other specified abnormal findings of blood chemistry: Secondary | ICD-10-CM

## 2023-07-13 DIAGNOSIS — R109 Unspecified abdominal pain: Secondary | ICD-10-CM | POA: Insufficient documentation

## 2023-07-13 DIAGNOSIS — R809 Proteinuria, unspecified: Secondary | ICD-10-CM

## 2023-07-13 DIAGNOSIS — Z13220 Encounter for screening for lipoid disorders: Secondary | ICD-10-CM

## 2023-07-13 LAB — LAB USE ONLY - CBC WITH DIFFERENTIAL
Absolute Basophils: 0.03 10*3/uL (ref 0.00–0.08)
Absolute Eosinophils: 0.08 10*3/uL (ref 0.00–0.44)
Absolute Immature Granulocytes: 0 10*3/uL (ref 0.00–0.07)
Absolute Lymphocytes: 1.26 10*3/uL (ref 0.42–3.22)
Absolute Monocytes: 0.3 10*3/uL (ref 0.21–0.85)
Absolute Neutrophils: 2.84 10*3/uL (ref 1.10–6.33)
Absolute nRBC: 0 10*3/uL (ref <=0.00)
Basophils %: 0.7 %
Eosinophils %: 1.8 %
Hematocrit: 40.6 % (ref 34.7–43.7)
Hemoglobin: 13.1 g/dL (ref 11.4–14.8)
Immature Granulocytes %: 0 %
Lymphocytes %: 27.9 %
MCH: 30.7 pg (ref 25.1–33.5)
MCHC: 32.3 g/dL (ref 31.5–35.8)
MCV: 95.1 fL (ref 78.0–96.0)
MPV: 10.3 fL (ref 8.9–12.5)
Monocytes %: 6.7 %
Neutrophils %: 62.9 %
Platelet Count: 224 10*3/uL (ref 142–346)
Preliminary Absolute Neutrophil Count: 2.84 10*3/uL (ref 1.10–6.33)
RBC: 4.27 10*6/uL (ref 3.90–5.10)
RDW: 14 % (ref 11–15)
WBC: 4.51 10*3/uL (ref 3.10–9.50)
nRBC %: 0 /100{WBCs} (ref <=0.0)

## 2023-07-13 LAB — FERRITIN: Ferritin: 74.6 ng/mL (ref 4.60–204.00)

## 2023-07-13 LAB — COMPREHENSIVE METABOLIC PANEL
ALT: 20 U/L (ref <=55)
AST (SGOT): 21 U/L (ref <=41)
Albumin/Globulin Ratio: 1.2 (ref 0.9–2.2)
Albumin: 3.8 g/dL (ref 3.5–5.0)
Alkaline Phosphatase: 68 U/L (ref 37–117)
Anion Gap: 9 (ref 5.0–15.0)
BUN: 15 mg/dL (ref 7–21)
Bilirubin, Total: 0.4 mg/dL (ref 0.2–1.2)
CO2: 22 meq/L (ref 17–29)
Calcium: 8.6 mg/dL (ref 8.5–10.5)
Chloride: 108 meq/L (ref 99–111)
Creatinine: 0.7 mg/dL (ref 0.4–1.0)
GFR: >60 mL/min/{1.73_m2} (ref >=60.0)
Globulin: 3.1 g/dL (ref 2.0–3.6)
Glucose: 89 mg/dL (ref 70–100)
Hemolysis Index: 15 {index}
Potassium: 4.1 meq/L (ref 3.5–5.3)
Protein, Total: 6.9 g/dL (ref 6.0–8.3)
Sodium: 139 meq/L (ref 135–145)

## 2023-07-13 LAB — VITAMIN B12: Vitamin B-12: 797 pg/mL (ref 211–911)

## 2023-07-13 LAB — URINE MICROALBUMIN, RANDOM
Urine Creatinine: 92 mg/dL
Urine Microalbumin/Creatinine Ratio: 10 ug/mg (ref <30)
Urine Microalbumin: 9 ug/mL (ref 0.0–30.0)

## 2023-07-13 LAB — URINALYSIS WITH REFLEX TO MICROSCOPIC EXAM IF INDICATED
Urine Bilirubin: NEGATIVE
Urine Glucose: NEGATIVE
Urine Ketones: NEGATIVE mg/dL
Urine Nitrite: NEGATIVE
Urine Protein: NEGATIVE
Urine Specific Gravity: 1.024 (ref 1.001–1.035)
Urine Urobilinogen: NORMAL mg/dL (ref 0.2–2.0)
Urine pH: 6.5 (ref 5.0–8.0)

## 2023-07-13 LAB — IRON PROFILE
Iron Saturation: 43 % (ref 15–50)
Iron: 111 ug/dL (ref 32–157)
TIBC: 260 ug/dL — ABNORMAL LOW (ref 265–497)
UIBC: 149 ug/dL (ref 126–382)

## 2023-07-13 LAB — THYROID STIMULATING HORMONE (TSH) WITH REFLEX TO FREE T4: TSH: 1.37 u[IU]/mL (ref 0.35–4.94)

## 2023-07-13 LAB — LYME ANTIBODIES TOTAL WITH REFLEX TO CONFIRMATION: Lyme Antibody Total: 0.41 {index} (ref 0.00–0.89)

## 2023-07-13 LAB — LIPID PANEL
Cholesterol / HDL Ratio: 3.3 {index}
Cholesterol: 166 mg/dL (ref <=199)
HDL: 50 mg/dL
LDL Calculated: 103 mg/dL — ABNORMAL HIGH (ref 0–99)
Triglycerides: 63 mg/dL
VLDL Calculated: 13 mg/dL (ref 10–40)

## 2023-07-13 LAB — VITAMIN D, 25 OH, TOTAL: Vitamin D 25-OH, Total: 29 ng/mL — ABNORMAL LOW (ref 30–100)

## 2023-07-13 MED ORDER — DICYCLOMINE HCL 20 MG PO TABS
ORAL_TABLET | ORAL | 5 refills | Status: AC
Start: 2023-07-13 — End: ?

## 2023-07-13 NOTE — Progress Notes (Signed)
 Subjective:      Patient ID: Leah Morgan is a 48 y.o. female     Chief Complaint   Patient presents with    Annual Exam        Meds in chart   Early CAD or CA in First degree family member none  Allergies nkda  Tobacco none  Alcohol  no issues  Exercise some  Drug use none  Concern for high risk sexual behavior or STD check none  Urine issues none  Colon cancer screening -->  Nov 2024 normal and EGD normal per patient  - US  in June 2024    Dental 2024  Vision 2024  Dematology after provider left room pt was asking about skin and collagen issues to nurse    For Women:    Pap  2023 normal per pt and pending GYN  Mammogram 2024 and will see GYN for this  Dexa n/a    PHQ-2 is neg    Other issues that are additional to physical --> 25 modifer    Pt late for appt but able to be seen but    Mutipile ?    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              The following sections were reviewed this encounter by the provider: Surg   Hx         Review of Systems   Constitutional:  Positive for fatigue. Negative for activity change, appetite change, fever and unexpected weight change.   HENT:  Negative for congestion, dental problem, hearing loss, postnasal drip, sore throat, tinnitus and trouble swallowing.    Eyes:  Negative for photophobia, pain and visual disturbance.   Respiratory:  Positive for apnea (??). Negative for  cough, chest tightness and shortness of breath.    Cardiovascular:  Negative for chest pain, palpitations and leg swelling.   Gastrointestinal:  Positive for abdominal pain (random). Negative for abdominal distention, anal bleeding, blood in stool, constipation, diarrhea, nausea, rectal pain and vomiting.   Endocrine: Negative for polydipsia, polyphagia and polyuria.   Genitourinary:  Negative for decreased urine volume, difficulty urinating, dysuria, enuresis, frequency, hematuria, pelvic pain, urgency and vaginal bleeding.   Musculoskeletal:  Negative for arthralgias and gait problem.   Skin:  Negative for rash.   Allergic/Immunologic: Negative for environmental allergies and food allergies.   Neurological:  Negative for dizziness, tremors, seizures, syncope, facial asymmetry, speech difficulty, weakness, light-headedness, numbness and headaches.   Hematological:  Negative for adenopathy. Does not bruise/bleed easily.   Psychiatric/Behavioral:  Negative for dysphoric mood, self-injury, sleep disturbance and suicidal ideas. The patient is not nervous/anxious.           BP 110/60 (BP Site: Left arm, Patient Position: Sitting, Cuff Size: Medium)   Pulse  80   Temp 98.1 F (36.7 C) (Tympanic)   Resp 16   Ht 1.499 m (4\' 11" )   Wt 53.1 kg (117 lb 1.6 oz)   LMP 06/28/2023 (Approximate)   BMI 23.65 kg/m     Objective:     Physical Exam  Vitals and nursing note reviewed.   Constitutional:       General: She is not in acute distress.     Appearance: Normal appearance. She is normal weight. She is not ill-appearing, toxic-appearing or diaphoretic.   HENT:      Head: Normocephalic and atraumatic.      Right Ear: Tympanic membrane, ear canal and external ear normal.      Left Ear: Tympanic membrane, ear canal and external ear normal.      Nose: Nose normal.      Mouth/Throat:      Mouth: Mucous membranes are moist.      Pharynx: Oropharynx is clear.   Eyes:      Extraocular Movements: Extraocular movements intact.       Conjunctiva/sclera: Conjunctivae normal.      Pupils: Pupils are equal, round, and reactive to light.   Cardiovascular:      Rate and Rhythm: Normal rate and regular rhythm.      Pulses: Normal pulses.      Heart sounds: Normal heart sounds. No murmur heard.  Pulmonary:      Effort: Pulmonary effort is normal.      Breath sounds: Normal breath sounds.   Abdominal:      General: Abdomen is flat. Bowel sounds are normal. There is no distension.      Palpations: Abdomen is soft. There is no mass.      Tenderness: There is no abdominal tenderness. There is no right CVA tenderness, left CVA tenderness, guarding or rebound.   Musculoskeletal:         General: Normal range of motion.      Cervical back: Normal range of motion and neck supple.      Right lower leg: No edema.      Left lower leg: No edema.   Skin:     General: Skin is warm and dry.   Neurological:      General: No focal deficit present.      Mental Status: She is alert and oriented to person, place, and time. Mental status is at baseline.      Motor: No weakness.      Coordination: Coordination normal.      Gait: Gait normal.   Psychiatric:         Mood and Affect: Mood normal.         Behavior: Behavior normal.         Thought Content: Thought content normal.         Judgment: Judgment normal.          Assessment:     1. Encounter for well adult exam without abnormal findings    2. Screening for diabetes mellitus (DM)  - Comprehensive Metabolic Panel; Future  - Comprehensive Metabolic Panel    3. Screening for lipid disorders  - Lipid Panel; Future  - Lipid Panel    4. Low vitamin B12 level  - Vitamin B12; Future  - Vitamin B12    5. Low vitamin D  level  - Vitamin D , 25 OH, Total; Future  - Vitamin D , 25 OH, Total    6. Chronic fatigue  - CBC with Differential (  Order); Future  - Iron Profile; Future  - Ferritin; Future  - Lyme Antibodies Total with Reflex to Confirmation; Future  - CBC with Differential (Order)  - Iron Profile  - Ferritin  - Lyme Antibodies  Total with Reflex to Confirmation  - Ambulatory referral to Sleep Medicine; Future    7. Suspected sleep apnea  - Thyroid  Stimulating Hormone (TSH) with Reflex to Free T4; Future  - CBC with Differential (Order); Future  - Iron Profile; Future  - Ferritin; Future  - Thyroid  Stimulating Hormone (TSH) with Reflex to Free T4  - CBC with Differential (Order)  - Iron Profile  - Ferritin  - Ambulatory referral to Sleep Medicine; Future    8. Proteinuria, unspecified type  - Urinalysis with Reflex to Microscopic Exam; Future  - Urine Microalbumin, Random; Future  - Urinalysis with Reflex to Microscopic Exam  - Urine Microalbumin, Random    9. Abdominal cramping  - dicyclomine  (BENTYL ) 20 MG tablet; Take one pill up to three times a day if needed for adominal pain.  Refills placed  Dispense: 30 tablet; Refill: 5        Plan:   Good general exam and vitals sign    Check labs and will add on as requested other labs for vitamins and fatigue    If snoring, fatigue, brian fog --> do you have sleep apnea??    Please be tested      [facility] --> P: [phone]  -[email or web address]  - multiple providers  - referral placed and call them now    Others to contact if cannot schedule with above in reasonable time     [facility]  [address]  [place], [place] [address]  [address]  [place]  [place]  [address]  Phone: [phone]  - Dr. Gladies Lambing Russo/Dr Salome Credit  - referral placed    Other locations if issues with scheduling above    [facility]    Dr. Firman Hughes  [address]  [place], [place] [address]  [phone]    or    [facility]     [address]  [place], [place] [address]  (P) [phone]     ALSO    Glad to hear that upper/lower scope is normal and normal ultrasound in June 2024    If have pain/cramps at times --> can be  colon spasm    I sent in     dicyclomine  (BENTYL ) 20 MG tablet; Take one pill up to three times a day if needed for adominal pain.  Refills placed  Dispense: 30 tablet; Refill: 5  - only when needed    For abdominal bloating and pain    Make sure to drink 4-5 bottles water per day --> about 80oz of water per day    Eat dry foods, such as crackers, toast, dry cereals, or bread sticks, when you wake up and every few hours during the day. They provide nutrients and help settle your stomach. Eat cool foods instead of hot, spicy foods. Consider non-fat yogurt, fruit juice, sherbet, and sports drinks    Low FODMAP eating --> look online for other foods that produce less "gas"    Daily overcounter probiotic once per day like ALIGN or IBGUARD everyday especially if this is a long term issue    Can use overcounter  pepcid  AC 20mg  one pill twice a day for gas/pain if needed    NOW    If having more daily issues then buy overcounter generic prilosec or prevacid and take for 2-4 weeks daily before breakfast to decrease issues  - can still use pecpid as needed during this time  - stop/start the generic prilosec or prevacid as needed     ALSO    For fatigue, brain fog, sleep issues --> hormone problems??  - not my specialty  - ask gynecology if they do any of this     If not here are other groups    [facility]  [place]ie Anda, [place]  [address]  Phone: [phone]  Email: [email or web address]     Others    Jossie Nine, NP  [email or web address]    [email or web address]    [email or web address]    [email or web address]    [email or web address]    [email or web address]     ALSO    Contact one of these dermatology offices to have skin check every 3-5 years    [facility]  [facility] [place] [facility]  [address],  [place], [place] [address]  [phone]    [facility]  Dr. Carleene Chase  [address]  [place], [place] [address] [phone]  [email or web address]    [facility]  Dr Elisa Guest  [address]  [place], [place] [address] [phone]  [email or web address]    [facility]  Dermatology  [address], [place], [place] [address]  Ph. [phone], Fax [phone]    [facility]   Dr. Andriette Banner [address]  [place], [place] [address] [phone]  [email or web address]      [facility]   Dr. Hobert Lull  [address] [place], [place] [address] [phone]  [email or web address]    [facility]  Dr. Loise Rinks Dang-[place]u  [address]  [place], [place] [address] [phone] [email or web address]    [facility] [place]  [phone]   [place], [place] [address]  [email or web address]    [facility] D[facility]  [address]  [place], [place] [address]  [phone]  [email or web address]     ALSO        As posted in each exam room now that during a well physical    Any new acute health problems, uncontrolled chronic medical conditions, or office procedures that is done today are not considered part of the physical and that an additional copayment or deductible may apply.  Thank you for understanding    FLU SHOTS 2024-2025    These will start at many of the pharmacies or grocery chains and will do through August to April 2024    Office will probably start in Oct 2024    If you are here in our office and we have the shots we will offer to you of course BUT once again having this done at any location any time between August 2024 and April 2025 is good      ALSO if you have not please update current Covid-19 booster    Can be done same time    Go to any local pharmacy or grocery store --> you  may be able to even schedule online    FOR SCHEDULING to AVOID calling the office     PLEASE USE MyChart    You can always schedule  15 minute OFFICE or VIDEO VISITS online  through MyChart on a daily basis     REMEMBER you can ONLY do video visits if you are in the state of [place]  with our providers.  You cannot be in [place]  and do a video visit with a provider in [place]  for example.    To make an office visit or  video visit just go to "Visits" at the top and click on "Schedule an appointment" then click on a provider then reason for visit is "office visit" or "video visit" and then pick date and time and self schedule    REMEMBER for Video Visits    We ONLY use the MyChart application and if issues then let us  know how we can help    ALSO    Send a message to our staff to schedule IN OFFICE physical or preop check     AND     You can write a message requesting a physical/office visit through MyChart message provider  - Recommend if you are requesting an in office visit or physical to write the dates you are available and if you want to be seen in the AM or PM so we can  expedite the process of scheduling for you.  You can write a message requesting a physical/office visit through MyChart message provider    Also     You can google "[facility]"  and click on "Book an appointment"    Find a provider and time that fits your needs do to visit    If video visit then schedule online    If needing office visit or physical then will have to call the office at [phone] so we can check your benefits and make sure your provider is in the office      Discussion of medicines, use, and side effects as applicable  Importance of avoiding tobacco, excessive alcohol, ilicit drug use, high risk sexual behavior and sun exposure is reviewed.  Seat belt use is encouraged.  If own firearm, make sure you have taken firearm classes. Keep unloaded and safe storage is imperative  Importance of regular moderate  exercise and low fat high fiber diet that includes 5 servings of fruits and vegetables a day is discussed.    Always be 15-20 minutes late before all appointments to maximize time with provider    Let us  know if any issues       Darrold Emms, DO

## 2023-07-13 NOTE — Patient Instructions (Addendum)
 Good general exam and vitals sign    Check labs and will add on as requested other labs for vitamins and fatigue    If snoring, fatigue, brian fog --> do you have sleep apnea??    Please be tested      [facility] --> P: [phone]  -[email or web address]  - multiple providers  - referral placed and call them now    Others to contact if cannot schedule with above in reasonable time    [facility] [place]  [address]  [place], [place] [address]  [address]  [place]  [place]  [address]  Phone: [phone]  - Dr. Debby Roller Russo/Dr Wadie Rusk  - referral placed    Other locations if issues with scheduling above    [facility]    Dr. Oneil Pinal  [address]  [place], [place] [address]  [phone]    or    [facility]     [address]  [place], [place] [address]  [phone]) [phone]     ALSO    Glad to hear that upper/lower scope is normal and normal ultrasound in June 2024    If have pain/cramps at times --> can be colon spasm    I sent in     dicyclomine  (BENTYL ) 20 MG tablet; Take one pill up to three times a day if needed for adominal pain.  Refills placed  Dispense: 30 tablet; Refill: 5  - only when needed    For abdominal bloating and pain    Make sure to drink 4-5 bottles water per day --> about 80oz of water per day    Eat dry foods, such as crackers, toast, dry cereals, or bread sticks, when you wake up and every few hours during the day. They provide nutrients and help settle your stomach. Eat cool foods instead of hot, spicy foods. Consider non-fat yogurt, fruit juice, sherbet, and sports drinks    Low FODMAP eating --> look online for other foods that produce less gas    Daily overcounter probiotic once per day like ALIGN or IBGUARD everyday especially if this is a long term issue    Can use overcounter pepcid  AC 20mg   one pill twice a day for gas/pain if needed    NOW    If having more daily issues then buy overcounter generic prilosec or prevacid and take for 2-4 weeks daily before breakfast to decrease issues  - can still use pecpid as needed during this time  - stop/start the generic prilosec or prevacid as needed     ALSO    For fatigue, brain fog, sleep issues --> hormone problems??  - not my specialty  - ask gynecology if they do any of this     If not here are other groups    [facility]  [place], [place]  [address]  Phone: [phone]  Email: [email or web address]     Others    Vernell Factor, NP  [email or web address]    [email or web address]    [email or web address]    [email or web address]    [email or web address]    [email or web address]     ALSO    Contact one of these dermatology offices to have skin check every 3-5 years    [facility]  [facility] [place] [facility]  [address],  [place], [place] [address]  [phone]    [facility]  Dr. Leni Cord  [address]  [place], [place] [address] [phone]  [email or web address]    [facility]  Dr Veleria Montes  [address]  [place], [place] [address] [phone]  [email or web address]    [facility]  Dermatology  [address], [place], [place] [address]  Ph. [phone], Fax [phone]    [facility]   Dr. Ozell Phillips [address]  [place], [place] [address] [phone]  [email or web address]      [facility]   Dr. Ricardo Guild  [address] [place], [place] [address] [phone]  [email or web address]    [facility]  Dr. Iantha Dang-Vu  [address]  [place], [place] [address]  [phone] [email or web address]    [facility] [place]  [phone]   [place], [place] [address]  [email or web address]    [facility]  [address]  [place], [place] [address]  [phone]  [email or web address]     ALSO        As posted in each exam room now that during a well physical    Any new acute health problems, uncontrolled chronic medical conditions, or office procedures that is done today are not considered part of the physical and that an additional copayment or deductible may apply.  Thank you for understanding    FLU SHOTS 2024-2025    These will start at many of the pharmacies or grocery chains and will do through August to April 2024    Office will probably start in Oct 2024    If you are here in our office and we have the shots we will offer to you of course BUT once again having this done at any location any time between August 2024 and April 2025 is good      ALSO if you have not please update current Covid-19 booster    Can be done same time    Go to any local pharmacy or grocery store --> you may be able to even schedule online    FOR SCHEDULING to AVOID calling the office     PLEASE USE MyChart    You can always schedule 15 minute OFFICE or VIDEO VISITS online  through MyChart on a daily basis     REMEMBER you can ONLY do video visits if you are in the state of [place]  with our providers.  You cannot be in [place]  and do a video visit with a provider in [place]  for example.    To make an office visit or  video visit just go to Visits at the top and click on Schedule an appointment then click on a provider then reason for visit is office visit or video visit and then pick date and time and self schedule    REMEMBER for Video Visits    We ONLY use the MyChart application and if issues then let us  know how we can help    ALSO    Send a message to our staff to schedule IN OFFICE physical or preop  check     AND     You can write a message requesting a physical/office visit through MyChart message provider  - Recommend if you are requesting an in office visit or physical to write the dates you are available and if you want to be seen  in the AM or PM so we can  expedite the process of scheduling for you.  You can write a message requesting a physical/office visit through MyChart message provider    Also     You can [facility]  and click on Book an appointment    Find a provider and time that fits your needs do to visit    If video visit then schedule online    If needing office visit or physical then will have to call the office at [phone] so we can check your benefits and make sure your provider is in the office      Discussion of medicines, use, and side effects as applicable  Importance of avoiding tobacco, excessive alcohol, ilicit drug use, high risk sexual behavior and sun exposure is reviewed.  Seat belt use is encouraged.  If own firearm, make sure you have taken firearm classes. Keep unloaded and safe storage is imperative  Importance of regular moderate exercise and low fat high fiber diet that includes 5 servings of fruits and vegetables a day is discussed.    Always be 15-20 minutes late before all appointments to maximize time with provider    Let us  know if any issues

## 2023-07-15 ENCOUNTER — Encounter (INDEPENDENT_AMBULATORY_CARE_PROVIDER_SITE_OTHER): Payer: Self-pay | Admitting: Family Medicine

## 2023-07-15 DIAGNOSIS — R3121 Asymptomatic microscopic hematuria: Secondary | ICD-10-CM | POA: Insufficient documentation

## 2023-07-15 DIAGNOSIS — E559 Vitamin D deficiency, unspecified: Secondary | ICD-10-CM | POA: Insufficient documentation

## 2023-07-28 ENCOUNTER — Encounter (INDEPENDENT_AMBULATORY_CARE_PROVIDER_SITE_OTHER): Payer: Self-pay

## 2023-08-25 ENCOUNTER — Encounter (INDEPENDENT_AMBULATORY_CARE_PROVIDER_SITE_OTHER): Payer: Self-pay

## 2023-09-25 ENCOUNTER — Encounter (INDEPENDENT_AMBULATORY_CARE_PROVIDER_SITE_OTHER): Payer: Self-pay

## 2023-10-25 ENCOUNTER — Encounter (INDEPENDENT_AMBULATORY_CARE_PROVIDER_SITE_OTHER): Payer: Self-pay

## 2023-11-25 ENCOUNTER — Encounter (INDEPENDENT_AMBULATORY_CARE_PROVIDER_SITE_OTHER): Payer: Self-pay

## 2023-12-25 ENCOUNTER — Encounter (INDEPENDENT_AMBULATORY_CARE_PROVIDER_SITE_OTHER): Payer: Self-pay

## 2024-01-25 ENCOUNTER — Encounter (INDEPENDENT_AMBULATORY_CARE_PROVIDER_SITE_OTHER): Payer: Self-pay

## 2024-02-20 ENCOUNTER — Ambulatory Visit (INDEPENDENT_AMBULATORY_CARE_PROVIDER_SITE_OTHER): Payer: Self-pay | Admitting: Family Medicine

## 2024-02-20 ENCOUNTER — Encounter (INDEPENDENT_AMBULATORY_CARE_PROVIDER_SITE_OTHER): Payer: Self-pay | Admitting: Family Medicine

## 2024-02-20 VITALS — BP 98/60 | HR 72 | Temp 98.0°F | Resp 12 | Ht 59.0 in | Wt 119.8 lb

## 2024-02-20 DIAGNOSIS — R109 Unspecified abdominal pain: Secondary | ICD-10-CM

## 2024-02-20 DIAGNOSIS — R1013 Epigastric pain: Secondary | ICD-10-CM

## 2024-02-20 DIAGNOSIS — F4322 Adjustment disorder with anxiety: Secondary | ICD-10-CM

## 2024-02-20 DIAGNOSIS — J4520 Mild intermittent asthma, uncomplicated: Secondary | ICD-10-CM

## 2024-02-20 DIAGNOSIS — Z23 Encounter for immunization: Secondary | ICD-10-CM

## 2024-02-20 MED ORDER — PANTOPRAZOLE SODIUM 40 MG PO TBEC: 40.0000 mg | DELAYED_RELEASE_TABLET | Freq: Every day | ORAL | 5 refills | Status: DC

## 2024-02-20 MED ORDER — ALBUTEROL SULFATE HFA 108 (90 BASE) MCG/ACT IN AERS
2.0000 | INHALATION_SPRAY | RESPIRATORY_TRACT | 5 refills | Status: AC | PRN
Start: 2024-02-20 — End: ?

## 2024-02-20 NOTE — Progress Notes (Signed)
 [address]  [place], [place] [address]  Ph. [phone], Fax [phone]          Subjective     CC:  Abdominal Pain (Constant generalized abdominal pain (5/10 pain scale average), ongoing for 2 weeks /- nausea, but no vomiting/- intermittent constipation, decreased appetite) and Medication Refill (Albuterol  inhaler at home, but not of dosage)     HPI:  History of Present Illness  Leah Morgan is a 48 year old female who presents with abdominal pain and constipation.    She has been experiencing significant abdominal pain for the past two weeks. The pain initially started in one area (lower) and then migrated up. It is described as persistent and has not improved over time. Associated symptoms include constipation and episodes of diarrhea in the past few days, though these bowel movements do not relieve the abdominal discomfort.    She underwent a colonoscopy either earlier this year or late last year, which returned normal results. Additionally, a sonogram last year due to similar abdominal pain also returned normal results. The only finding from previous evaluations was gastritis, which resolved with the use of over-the-counter acid reducers. Recently, she has been using an acid reducer, which provides some relief when taken on an empty stomach.    She mentions increased stress at work, which she believes may be contributing to her symptoms. She has been unable to eat properly and has not been able to go to the office due to feeling 'devastated' by recent work-related stressors. She also reports increased burping and passing gas more than usual, without any specific dietary triggers.    Her menstrual period is four days late, which she attributes to stress. Her last menstrual period was on August 4th.    She experiences pain in her right ear, which woke her up at night last week, and describes it as radiating.    ROS:   Review of Systems   as per HPI        Objective     Visit Vitals  BP  98/60 (BP Site: Left arm, Patient Position: Sitting, Cuff Size: Medium)   Pulse 72   Temp 98 F (36.7 C) (Tympanic)   Resp 12   Ht 1.499 m (4' 11)   Wt 54.3 kg (119 lb 12.8 oz)   BMI 24.20 kg/m       Physical Exam  Vitals reviewed.   Constitutional:       General: She is not in acute distress.     Appearance: Normal appearance. She is not ill-appearing.   HENT:      Head: Normocephalic. No right periorbital erythema or left periorbital erythema.   Eyes:      General: Gaze aligned appropriately. No scleral icterus.     Extraocular Movements:      Right eye: Normal extraocular motion and no nystagmus.      Left eye: Normal extraocular motion and no nystagmus.   Cardiovascular:      Rate and Rhythm: Normal rate and regular rhythm.      Heart sounds: Normal heart sounds.   Pulmonary:      Effort: Pulmonary effort is normal. No respiratory distress.      Breath sounds: Normal breath sounds and air entry.   Abdominal:      General: Abdomen is flat. Bowel sounds are normal.      Palpations: Abdomen is soft.      Tenderness: There is abdominal tenderness in the epigastric  area and left lower quadrant. There is no right CVA tenderness, left CVA tenderness, guarding or rebound.      Hernia: There is no hernia in the umbilical area.   Musculoskeletal:      Cervical back: No torticollis.   Skin:     Coloration: Skin is not cyanotic or jaundiced.   Neurological:      Mental Status: She is alert.      Cranial Nerves: No facial asymmetry.      Motor: No tremor.   Psychiatric:         Attention and Perception: Attention normal. She is attentive.         Mood and Affect: Mood and affect normal. Affect is not inappropriate.         Speech: Speech normal.         Behavior: Behavior normal.         Thought Content: Thought content normal.         Judgment: Judgment normal.       Physical Exam  HEENT: Tenderness in right TMJ.  ABDOMEN: Tenderness on left side of abdomen.     Results  RADIOLOGY  Abdominal ultrasound: Normal  (2024)    DIAGNOSTIC REPORTS  Colonoscopy: Normal (2024)          Assessment/Plan     Assessment & Plan  Abdominal pain, unspecified abdominal location    Orders:    CBC with Differential (Order); Future    Comprehensive Metabolic Panel; Future    Lipase; Future    Beta HCG Quantitative, Pregnancy; Future    CT Abdomen Pelvis W IV And PO Cont; Future    Mild intermittent asthma without complication    Orders:    albuterol  sulfate HFA (PROVENTIL ) 108 (90 Base) MCG/ACT inhaler; Inhale 2 puffs into the lungs every 4 (four) hours as needed for Wheezing    Need for vaccination    Orders:    Flu vaccine, TRIVALENT, 6 months and older (FLUARIX /FLULAVAL /FLUZONE), single-dose PF, 0.5 mL    Dyspepsia    Orders:    pantoprazole  (PROTONIX ) 40 MG tablet; Take 1 tablet (40 mg) by mouth once daily    Adjustment disorder with anxious mood  due to stressors at work          Assessment & Plan  Abdominal pain with constipation and bloating: recurring, chronic  Abdominal pain with constipation and bloating for two weeks. Previous normal colonoscopy and sonogram. Possible colitis or diverticulitis. Stress may contribute to symptoms.  - Order blood work.  - Order CT scan for colitis or diverticulitis.  - Advise ER visit if pain worsens or fevers develop.  - Prescribe protonix  on empty stomach for 2-4 weeks.  - Provide work absence note until next Monday.    Temporomandibular joint (TMJ) pain, right side  Right-sided TMJ pain likely due to stress-induced teeth grinding or jaw clenching.  - Recommend ibuprofen or Aleve otc    Mild intermittent asthma  Acute symptoms reported.  - Refill albuterol  inhaler    Verbal consent obtained to record this visit.       No follow-ups on file.

## 2024-02-20 NOTE — Assessment & Plan Note (Signed)
 Orders:    albuterol sulfate HFA (PROVENTIL) 108 (90 Base) MCG/ACT inhaler; Inhale 2 puffs into the lungs every 4 (four) hours as needed for Wheezing

## 2024-02-21 ENCOUNTER — Ambulatory Visit (INDEPENDENT_AMBULATORY_CARE_PROVIDER_SITE_OTHER): Payer: Self-pay | Admitting: Family Medicine

## 2024-02-21 LAB — CBC AND DIFFERENTIAL
Baso(Absolute): 0 x10E3/uL (ref 0.0–0.2)
Basophils Automated: 0 %
Eosinophils Absolute: 0.1 x10E3/uL (ref 0.0–0.4)
Eosinophils Automated: 3 %
Hematocrit: 46.1 % (ref 34.0–46.6)
Hemoglobin: 14.2 g/dL (ref 11.1–15.9)
Immature Granulocytes Absolute: 0 x10E3/uL (ref 0.0–0.1)
Immature Granulocytes: 0 %
Lymphocytes Absolute: 1.1 x10E3/uL (ref 0.7–3.1)
Lymphocytes Automated: 21 %
MCH: 30.2 pg (ref 26.6–33.0)
MCHC: 30.8 g/dL — ABNORMAL LOW (ref 31.5–35.7)
MCV: 98 fL — ABNORMAL HIGH (ref 79–97)
Monocytes Absolute: 0.3 x10E3/uL (ref 0.1–0.9)
Monocytes: 6 %
Neutrophils Absolute Count: 3.6 x10E3/uL (ref 1.4–7.0)
Neutrophils: 70 %
Platelets: 234 x10E3/uL (ref 150–450)
RBC: 4.7 x10E6/uL (ref 3.77–5.28)
RDW: 13.2 % (ref 11.7–15.4)
WBC: 5.2 x10E3/uL (ref 3.4–10.8)

## 2024-02-21 LAB — COMPREHENSIVE METABOLIC PANEL
ALT: 21 IU/L (ref 0–32)
AST (SGOT): 19 IU/L (ref 0–40)
Albumin: 4.2 g/dL (ref 3.9–4.9)
Alkaline Phosphatase: 81 IU/L (ref 44–121)
BUN / Creatinine Ratio: 18 (ref 9–23)
BUN: 12 mg/dL (ref 6–24)
Bilirubin, Total: 0.3 mg/dL (ref 0.0–1.2)
CO2: 23 mmol/L (ref 20–29)
Calcium: 9.2 mg/dL (ref 8.7–10.2)
Chloride: 102 mmol/L (ref 96–106)
Creatinine: 0.67 mg/dL (ref 0.57–1.00)
Globulin, Total: 2.9 g/dL (ref 1.5–4.5)
Glucose: 98 mg/dL (ref 70–99)
Potassium: 4.2 mmol/L (ref 3.5–5.2)
Protein, Total: 7.1 g/dL (ref 6.0–8.5)
Sodium: 138 mmol/L (ref 134–144)
eGFR: 108 mL/min/1.73 (ref >59)

## 2024-02-21 LAB — BETA HCG QUANTITATIVE, PREGNANCY: human chorionic gonadotropin (hCG), Beta Chain, Quant., S: <1 m[IU]/mL

## 2024-02-21 LAB — LIPASE: Lipase: 37 U/L (ref 14–72)

## 2024-02-26 ENCOUNTER — Encounter (INDEPENDENT_AMBULATORY_CARE_PROVIDER_SITE_OTHER): Payer: Self-pay

## 2024-02-28 ENCOUNTER — Encounter (INDEPENDENT_AMBULATORY_CARE_PROVIDER_SITE_OTHER): Payer: Self-pay | Admitting: Family Medicine

## 2024-02-28 ENCOUNTER — Telehealth (INDEPENDENT_AMBULATORY_CARE_PROVIDER_SITE_OTHER): Admitting: Family Medicine

## 2024-02-28 DIAGNOSIS — R109 Unspecified abdominal pain: Secondary | ICD-10-CM

## 2024-02-28 DIAGNOSIS — N83202 Unspecified ovarian cyst, left side: Secondary | ICD-10-CM

## 2024-02-28 NOTE — Progress Notes (Signed)
 [address]  [place], [place] [address]  Ph. [phone], Fax [phone]      Telehealth:  The Patient has given verbal consent for delivery of health care via telehealth.   The patient is located at Home in [place]   The encounter provider is located at their Medical Office in [place]   Epic Video Client was utilized for Real Time/Synchronous Telehealth.   The time spent in medical discussion during this visit was 10 minutes.        Subjective     CC:  No chief complaint on file.     HPI:  History of Present Illness  Leah Morgan is a 48 year old female who presents with abdominal pain and concerns about CT scan findings.    She has been experiencing lower abdominal pain that initially started low and then moved upwards. Despite medication, the pain persists, although it helps slightly. The pain seems to worsen with stress and improve on weekends.    A recent CT scan showed a 4.5 cm simple cyst in the left ovary and a small cyst in the right ovary. She has had similar cysts in the past, which have resolved on her own.    The CT scan also revealed a minimally distended stomach with fluid and a small bony hemangioma at L1 in the spine, which is an incidental finding. Additionally, there is atelectasis noted in the lungs.    She has undergone a colonoscopy, endoscopy, and a sonogram last year as part of her previous workup.    ROS:   Review of Systems   as per HPI        Objective     There were no vitals taken for this visit.    Physical Exam  Physical Exam       Results  RADIOLOGY  Abdominal CT: Liver normal with a small cyst; kidneys normal with a small cyst on the left; no lymphadenopathy or ascites; aorta and veins normal; no hernia; stomach minimally distended with fluid; small bowel normal; appendix normal; no colitis, inflammation, or diverticulitis; 4.5 cm simple cyst in the left ovary; small cyst in the right ovary; small bony hemangioma in L1 vertebra; atelectasis in the lung.           Assessment/Plan     Assessment & Plan  Left ovarian cyst  new dx, follow to monitor stability/resolution  Orders:    US  Pelvis Complete; Future    Abdominal pain, unspecified abdominal location  sx worse during the week, better on weekend, seems to correlate to work stressors          Assessment & Plan  Abdominal pain with constipation and bloating  Pain likely due to ovarian cysts, possibly ruptured. CT scan ruled out other abdominal pathologies. Stress may exacerbate pain.    Left ovarian simple cyst  4.5 cm simple cyst in left ovary, benign. Possible rupture causing abdominal pain.  - Pelvic ultrasound in 3 months to monitor cyst size and resolution.    Adjustment disorder with anxious mood  Stress and anxiety contribute to symptoms, particularly during workweek.    General Health Maintenance  CT scan showed incidental findings: small bony hemangioma in L1, minimal lung atelectasis, benign liver and kidney cysts.    Verbal consent obtained to record this visit.       No follow-ups on file.

## 2024-05-29 ENCOUNTER — Other Ambulatory Visit: Payer: Self-pay | Admitting: Family Medicine

## 2024-05-30 ENCOUNTER — Telehealth (INDEPENDENT_AMBULATORY_CARE_PROVIDER_SITE_OTHER): Payer: Self-pay | Admitting: Family Medicine

## 2024-05-30 DIAGNOSIS — N83202 Unspecified ovarian cyst, left side: Secondary | ICD-10-CM

## 2024-05-30 NOTE — Telephone Encounter (Signed)
 Name of Requester: [facility]     Clinical Question: Per caller from [facility] requesting for an order update on   US  Pelvis Complete (Order #[id number]) on 02/28/24   To be changed to transvaginal US  for pt's appt yesterday. Order was for wrong location so transvaginal us  was done instead. Please advise    Patient's last [VV / OV / PE]:vv 02/28/2024    Requester's preferred ph:[phone]     Patient available on portal:yes    Please note:  Fax [phone]

## 2024-05-30 NOTE — Addendum Note (Signed)
 Addended by: Antionio Negron on: 05/30/2024 12:45 PM     Modules accepted: Orders

## 2024-05-30 NOTE — Telephone Encounter (Signed)
 order done

## 2024-06-02 ENCOUNTER — Ambulatory Visit (INDEPENDENT_AMBULATORY_CARE_PROVIDER_SITE_OTHER): Payer: Self-pay | Admitting: Family Medicine

## 2024-06-27 ENCOUNTER — Other Ambulatory Visit: Payer: Self-pay | Admitting: Physician Assistant

## 2024-07-08 ENCOUNTER — Encounter (INDEPENDENT_AMBULATORY_CARE_PROVIDER_SITE_OTHER): Payer: Self-pay | Admitting: Family Medicine

## 2024-07-08 ENCOUNTER — Ambulatory Visit (INDEPENDENT_AMBULATORY_CARE_PROVIDER_SITE_OTHER): Admitting: Family Medicine

## 2024-07-08 VITALS — BP 98/66 | HR 76 | Temp 96.8°F | Resp 16 | Ht 58.5 in | Wt 120.5 lb

## 2024-07-08 DIAGNOSIS — Z0000 Encounter for general adult medical examination without abnormal findings: Secondary | ICD-10-CM

## 2024-07-08 DIAGNOSIS — L299 Pruritus, unspecified: Secondary | ICD-10-CM

## 2024-07-08 DIAGNOSIS — R1012 Left upper quadrant pain: Secondary | ICD-10-CM

## 2024-07-08 DIAGNOSIS — Z131 Encounter for screening for diabetes mellitus: Secondary | ICD-10-CM

## 2024-07-08 DIAGNOSIS — Z13220 Encounter for screening for lipoid disorders: Secondary | ICD-10-CM

## 2024-07-08 DIAGNOSIS — R1013 Epigastric pain: Secondary | ICD-10-CM

## 2024-07-08 DIAGNOSIS — M899 Disorder of bone, unspecified: Secondary | ICD-10-CM

## 2024-07-08 MED ORDER — PANTOPRAZOLE SODIUM 40 MG PO TBEC: 40.0000 mg | DELAYED_RELEASE_TABLET | Freq: Every day | ORAL | 5 refills | Status: AC

## 2024-07-08 MED ORDER — VALACYCLOVIR HCL 1 G PO TABS: 1000.0000 mg | ORAL_TABLET | Freq: Three times a day (TID) | ORAL | 0 refills | Status: AC

## 2024-07-08 NOTE — Progress Notes (Signed)
 [address]  [place], [place] [address]  Ph. [phone], [place] [phone]             Date of Exam: 07/08/2024 4:10 PM        Patient ID: Leah Morgan is a 49 y.o. female.  Attending Physician: Rankin LOISE Camp, MD        Chief Complaint:     Chief Complaint   Patient presents with    Annual Exam     Fasting: yes  Pap: 07/12/2022  Mammogram: 11/29/2023  Colonoscopy: 06/27/2023            HPI:     Here today for routine wellness exam  feels well with no/few symptoms  normal mood  exercise on regular basis  good physical condition  healthy weight  does not use tobacco products  does not consume alcohol in excess  normal sleep  uses seatbelts  uses sunscreen     History of Present Illness  Leah Morgan is a 49 year old female who presents for a physical exam and evaluation of back itching and a forehead bump.    She mentions a delay in her menstrual cycle by 19 days, which is unusual for her as she is typically regular. She wonders if this could be related to perimenopause, although her family history suggests menopause occurs later, with her mother, aunt, and grandmother all experiencing menopause at age 10.    She has had several moles removed due to moderate atypia, with one on her foot recently biopsied and another scheduled for removal. She uses sunscreen regularly.    She was laid off from her job on October 31st, which initially caused distress but ultimately led to a positive change in her stress levels and overall happiness. 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 No chest pain, shallow breathing, blood in urine or stools, and significant pain with bowel movements. She reports a history of acid reflux, which has improved since leaving her job.    Issues addressed outside the scope of a routine wellness visit:    She describes a new onset of an annoying itching sensation on the right side of her back that began a week ago, similar to having a clothing  tag rubbing against her skin. She has been scratching it frequently and also experienced a stabbing sensation on the right side. Her mother provided her with acyclovir, which helped alleviate the symptoms. She has a history of a similar episode before the COVID-19 pandemic, where she was treated with acyclovir for suspected shingles, although she never developed a rash.    She has a firm bump on her forehead, present since 2010, which she believes may have grown slightly over the years. Previous evaluations suggested it was an excess of bone, and a recent ultrasound did not show anything concerning but could not evaluate how deep it is. However, comments from friends and family have prompted her to consider further evaluation.    She reports a history of gastritis and stomach pains, which have improved since she left a stressful job in October. She was previously on pantoprazole  but stopped taking it when her symptoms improved. She notes a slight return of discomfort and is considering resuming medication. A CT scan in September was normal.         Problem List:     Problem List[1]       Current Meds:      Current  Medications[2]     Allergies:       Allergies[3]     Past Surgical History:       Past Surgical History[4]     Family History:       Family History[5]     Social History:      Social History[6]     The following sections were reviewed this encounter by the provider:           Vital Signs:       Visit Vitals  BP 98/66 (BP Site: Left arm, Patient Position: Sitting, Cuff Size: Medium)   Pulse 76   Temp (!) 96.8 F (36 C) (Tympanic)   Resp 16   Ht 1.486 m (4' 10.5)   Wt 54.7 kg (120 lb 8 oz)   BMI 24.76 kg/m        ROS:       Review of Systems   Constitutional:  Negative for activity change, fatigue and unexpected weight change.   HENT:  Negative for ear pain, hearing loss, sinus pain and trouble swallowing.    Eyes:  Negative for pain and visual disturbance.   Respiratory:  Negative for chest tightness and  shortness of breath.    Cardiovascular:  Negative for chest pain, palpitations and leg swelling.   Gastrointestinal:  Negative for abdominal pain, blood in stool, constipation, diarrhea, nausea and vomiting.   Endocrine: Negative for cold intolerance, heat intolerance, polydipsia and polyuria.   Genitourinary:  Negative for difficulty urinating, menstrual problem and pelvic pain.   Musculoskeletal:  Negative for arthralgias and joint swelling.   Skin:  Negative for color change and rash.   Neurological:  Negative for dizziness, tremors, speech difficulty, weakness, light-headedness, numbness and headaches.   Hematological:  Negative for adenopathy. Does not bruise/bleed easily.   Psychiatric/Behavioral:  Negative for dysphoric mood and suicidal ideas.    All other systems reviewed and are negative.         Physical Exam:      Physical Exam  Vitals reviewed.   Constitutional:       General: She is not in acute distress.     Appearance: Normal appearance. She is not ill-appearing.   HENT:      Head: Normocephalic and atraumatic.      Right Ear: Tympanic membrane, ear canal and external ear normal.      Left Ear: Tympanic membrane, ear canal and external ear normal.      Nose: Nose normal.      Mouth/Throat:      Mouth: Mucous membranes are moist. No oral lesions.   Eyes:      General: Lids are normal.      Extraocular Movements: Extraocular movements intact.      Right eye: No nystagmus.      Left eye: No nystagmus.      Pupils: Pupils are equal, round, and reactive to light.   Neck:      Thyroid : No thyroid  mass, thyromegaly or thyroid  tenderness.      Vascular: No carotid bruit.   Cardiovascular:      Rate and Rhythm: Normal rate and regular rhythm.      Pulses:           Radial pulses are 2+ on the right side and 2+ on the left side.        Femoral pulses are 2+ on the right side and 2+ on the left side.       Dorsalis  pedis pulses are 2+ on the right side and 2+ on the left side.        Posterior tibial pulses are  2+ on the right side and 2+ on the left side.      Heart sounds: Normal heart sounds.   Pulmonary:      Effort: Pulmonary effort is normal. No respiratory distress.      Breath sounds: Normal breath sounds and air entry. No decreased breath sounds, wheezing, rhonchi or rales.   Abdominal:      General: Abdomen is flat. Bowel sounds are normal. There is no distension.      Palpations: Abdomen is soft. There is no hepatomegaly, splenomegaly or mass.      Tenderness: There is abdominal tenderness (mild) in the left upper quadrant. There is no right CVA tenderness, left CVA tenderness, guarding or rebound.      Hernia: There is no hernia in the umbilical area or ventral area.   Musculoskeletal:      Cervical back: Neck supple.      Right lower leg: No edema.      Left lower leg: No edema.   Lymphadenopathy:      Cervical: No cervical adenopathy.   Skin:     General: Skin is warm and dry.      Findings: No lesion.      Nails: There is no clubbing.   Neurological:      General: No focal deficit present.      Mental Status: She is alert.      Motor: No tremor.      Deep Tendon Reflexes: Reflexes are normal and symmetric.   Psychiatric:         Behavior: Behavior is cooperative.         Thought Content: Thought content normal.         Physical Exam  HEENT: Firm nodule left of midline on forehead, under 1 cm, non-tender, non-mobile  ABDOMEN: Mild tenderness in the LUQ abdomen.  SKIN: No rashes or lesions on the skin on the back in area of pt's itchy sx    Results  Radiology  Frontal scalp ultrasound (left of midline): Excess osseous tissue; no suspicious findings; limited assessment of deep structures  Abdominal CT (02/2024): unremarkable    Pathology  Skin biopsy: Moderate atypical moles        Assessment /Plan:     Assessment & Plan  Wellness examination    Orders:    CBC with Differential (Order); Future    Comprehensive Metabolic Panel; Future    Lipid Panel; Future    Hemoglobin A1C; Future    Thyroid  Cascade Profile;  Future    Screening for diabetes mellitus    Orders:    Comprehensive Metabolic Panel; Future    Hemoglobin A1C; Future    Lipid screening    Orders:    Lipid Panel; Future    Dyspepsia  LUQ pain      Orders:    pantoprazole  (PROTONIX ) 40 MG tablet; Take 1 tablet (40 mg) by mouth once daily    Referral to Gastroenterology    Itching    Orders:    valACYclovir  HCL (VALTREX ) 1000 MG tablet; Take 1 tablet (1,000 mg) by mouth 3 (three) times daily for 7 days    Frontal skull lesion    Orders:    CT Head W WO Contrast; Future           Health Maintenance    Chronic  medications were discussed with patient and refilled  Chronic medications were discussed with patient and she may call for refills when needed   All questions answered  Proper use, potential side effects, risk/benefits of medicine(s) were discussed with patient  Get at least 150 minutes per week of moderate-intensity aerobic activity or 75 minutes per week of vigorous aerobic activity, or a combination of both, preferably spread throughout the week.  Eat a healthy diet with with plenty of fruits and vegetables; includes lean meats, poultry, fish, beans, and nuts; low in saturated fats, trans fats, cholesterol, salt (sodium), and added sugars  Call or follow up with any questions/concerns       Assessment & Plan  Annual physical examination  Routine annual physical examination with multiple concerns addressed, including pruritus, bony lesion, delayed menstrual period, and history of dyspepsia. 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  - Ordered blood tests for anemia, kidney, liver, electrolytes, diabetes, cholesterol, and thyroid .      Issues addressed outside the scope of a routine wellness visit:    Pruritus, possible herpes zoster, similar to prior sx that resolved with acyclovir in the past  Pruritus with sensation of a tag on the back, possibly related to herpes zoster.  Symptoms improved with acyclovir (tooks some leftover pills). Differential includes internal shingles, though less likely due to bilateral presentation. Discussed contagious nature of shingles, especially with rash.  - Prescribed valacyclovir  1 pill three times a day for 7 days.  - Advised to drink plenty of water while taking medication.    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 Ultrasound showed excess bone but no suspicious findings. Possible slow growth over time. Discussed potential need for CT scan to assess depth and nature of the lesion.  - Ordered CT scan of the head with contrast to assess the bony lesion.    Dyspepsia, history of gastroesophageal reflux disease  Dyspepsia with history of GERD. Symptoms improved since stopping pantoprazole  in October. Discussed potential for ulcer and need for further evaluation if symptoms persist.  - Refilled pantoprazole  and advised to take for 2-4 weeks.  - Ordered abdominal ultrasound to further eval.  - Advised follow-up with gastroenterologist     Verbal consent obtained to record this visit.    Follow-up:       No follow-ups on file.     Rankin LOISE Camp, MD                    [1]   Patient Active Problem List  Diagnosis    Uterine fibroid    Seasonal allergic rhinitis    Seasonal allergic conjunctivitis    Chronic vasomotor rhinitis    Mild intermittent asthma without complication    History of Helicobacter infection    Abdominal cramping    Suspected sleep apnea    Vitamin D  deficiency disease    Asymptomatic microscopic hematuria   [2]   Current Outpatient Medications:     albuterol  sulfate HFA (PROVENTIL ) 108 (90 Base) MCG/ACT inhaler, Inhale 2 puffs into the lungs every 4 (four) hours as needed for Wheezing, Disp: 1 each, Rfl: 5    Ascorbic Acid (VITAMIN C) 100 MG tablet, Take 1 tablet (100 mg) by mouth daily, Disp: , Rfl:     Paragard Intrauterine Copper IUD, by  Intrauterine route, Disp: ,  Rfl:     VITAMIN D  PO, Take by mouth, Disp: , Rfl:     dicyclomine  (BENTYL ) 20 MG tablet, Take one pill up to three times a day if needed for adominal pain.  Refills placed (Patient not taking: Reported on 02/20/2024), Disp: 30 tablet, Rfl: 5    fluticasone furoate  (Arnuity Ellipta ) 100 MCG/ACT Aerosol Pwdr, Breath Activated, Inhale 1 puff into the lungs daily (Patient not taking: Reported on 02/20/2024), Disp: 30 puff, Rfl: 5    pantoprazole  (PROTONIX ) 40 MG tablet, Take 1 tablet (40 mg) by mouth once daily, Disp: 30 tablet, Rfl: 5    valACYclovir  HCL (VALTREX ) 1000 MG tablet, Take 1 tablet (1,000 mg) by mouth 3 (three) times daily for 7 days, Disp: 21 tablet, Rfl: 0  [3] No Known Allergies  [4]   Past Surgical History:  Procedure Laterality Date    COLONOSCOPY W/ BIOPSIES  04/20/2023    Normal    COLONOSCOPY, DIAGNOSTIC (SCREENING)  07/21/2019    DENTAL SURGERY      DENTAL SURGERY  2021    EGD  04/20/2023    normal mild gerd    FOOT SURGERY      [facility]  2021    RECONSTRUCTION, SEPTAL     [5]   Family History  Problem Relation Name Age of Onset    Hypertension Mother      Hypertension Father      Hyperlipidemia Father      Heart disease Father      Kidney disease Father      Kidney disease Brother      Cancer Paternal Aunt      Diabetes Maternal Grandmother      Cancer Maternal Grandmother      Glaucoma Maternal Grandmother      Diabetes Maternal Grandfather      Cancer Maternal Grandfather      Diabetes Paternal Grandmother     [6]   Social History  Tobacco Use    Smoking status: Never    Smokeless tobacco: Never   Vaping Use    Vaping status: Never Used   Substance Use Topics    Alcohol use: Yes     Alcohol/week: 2.0 - 6.0 standard drinks of alcohol     Types: 2 - 6 Standard drinks or equivalent per week     Comment: 4 drinks per week    Drug use: Never

## 2024-07-08 NOTE — Patient Instructions (Signed)
 Healthy Lifestyle    A healthy lifestyle can help you feel good, stay at a healthy weight, and have plenty of energy for both work and play. A healthy lifestyle is something you can share with your whole family.    A healthy lifestyle also can lower your risk for serious health problems, such as high blood pressure, heart disease, and diabetes.    You can follow a few steps listed below to improve your health and the health of your family.    Follow-up care is a key part of your treatment and safety. Be sure to make and go to all appointments, and call your doctor if you are having problems. It's also a good idea to know your test results and keep a list of the medicines you take.    How can you care for yourself at home?  Do not eat too much sugar, fat, or fast foods. You can still have dessert and treats now and then. The goal is moderation.  Start small to improve your eating habits. Pay attention to portion sizes, drink less juice and soda pop, and eat more fruits and vegetables.  Eat a healthy amount of food. A 3-ounce serving of meat, for example, is about the size of a deck of cards. Fill the rest of your plate with vegetables and whole grains.  Limit the amount of soda and sports drinks you have every day. Drink more water when you are thirsty.  Eat at least 5 servings of fruits and vegetables every day. It may seem like a lot, but it is not hard to reach this goal. A serving or helping is 1 piece of fruit, 1 cup of vegetables, or 2 cups of leafy, raw vegetables. Have an apple or some carrot sticks as an afternoon snack instead of a candy bar. Try to have fruits and/or vegetables at every meal.  Make exercise part of your daily routine. You may want to start with simple activities, such as walking, bicycling, or slow swimming. Try to be active 30 to 60 minutes every day. You do not need to do all 30 to 60 minutes all at once. For example, you can exercise 3 times a day for 10 or 20 minutes. Moderate exercise  is safe for most people, but it is always a good idea to talk to your doctor before starting an exercise program.  Keep moving. Mow the lawn, work in the garden, or [employer]. Take the stairs instead of the elevator at work.  If you smoke, quit. People who smoke have an increased risk for heart attack, stroke, cancer, and other lung illnesses. Quitting is hard, but there are ways to boost your chance of quitting tobacco for good.  Use nicotine gum, patches, or lozenges.  Ask your doctor about stop-smoking programs and medicines.  Keep trying.  In addition to reducing your risk of diseases in the future, you will notice some benefits soon after you stop using tobacco. If you have shortness of breath or asthma symptoms, they will likely get better within a few weeks after you quit.  Limit how much alcohol you drink. Moderate amounts of alcohol (up to 2 drinks a day for men, 1 drink a day for women) are okay. But drinking too much can lead to liver problems, high blood pressure, and other health problems.        Family health  If you have a family, there are many things you can do together to improve  your health.    Eat meals together as a family as often as possible.  Eat healthy foods. This includes fruits, vegetables, lean meats and dairy, and whole grains.  Include your family in your fitness plan. Most people think of activities such as jogging or tennis as the way to fitness, but there are many ways you and your family can be more active. Anything that makes you breathe hard and gets your heart pumping is exercise. Here are some tips:  Walk to do errands or to take your child to school or the bus.  Go for a family bike ride after dinner instead of watching TV.       Visit [employer] Eating Plate for more information on a healthy diet    Life's Essential 8 are the key measures for improving and maintaining cardiovascular health, as defined by the American [employer] Association. Better cardiovascular health  helps lower the risk for heart disease, stroke and other major health problems.   Visit [email or web address].

## 2024-07-09 ENCOUNTER — Ambulatory Visit (INDEPENDENT_AMBULATORY_CARE_PROVIDER_SITE_OTHER): Payer: Self-pay | Admitting: Family Medicine

## 2024-07-09 LAB — CBC AND DIFFERENTIAL
Baso(Absolute): 0 x10E3/uL (ref 0.0–0.2)
Basophils Automated: 1 %
Eosinophils Absolute: 0.1 x10E3/uL (ref 0.0–0.4)
Eosinophils Automated: 2 %
Hematocrit: 43.5 % (ref 34.0–46.6)
Hemoglobin: 14.4 g/dL (ref 11.1–15.9)
Immature Granulocytes Absolute: 0 x10E3/uL (ref 0.0–0.1)
Immature Granulocytes: 0 %
Lymphocytes Absolute: 1.2 x10E3/uL (ref 0.7–3.1)
Lymphocytes Automated: 31 %
MCH: 31.3 pg (ref 26.6–33.0)
MCHC: 33.1 g/dL (ref 31.5–35.7)
MCV: 95 fL (ref 79–97)
Monocytes Absolute: 0.3 x10E3/uL (ref 0.1–0.9)
Monocytes: 7 %
Neutrophils Absolute Count: 2.4 x10E3/uL (ref 1.4–7.0)
Neutrophils: 59 %
Platelets: 222 x10E3/uL (ref 150–450)
RBC: 4.6 x10E6/uL (ref 3.77–5.28)
RDW: 12.9 % (ref 11.7–15.4)
WBC: 3.9 x10E3/uL (ref 3.4–10.8)

## 2024-07-09 LAB — COMPREHENSIVE METABOLIC PANEL
ALT: 28 IU/L (ref 0–32)
AST (SGOT): 27 IU/L (ref 0–40)
Albumin: 4.3 g/dL (ref 3.9–4.9)
Alkaline Phosphatase: 85 IU/L (ref 41–116)
BUN / Creatinine Ratio: 23 (ref 9–23)
BUN: 14 mg/dL (ref 6–24)
Bilirubin, Total: 0.4 mg/dL (ref 0.0–1.2)
CO2: 25 mmol/L (ref 20–29)
Calcium: 9.4 mg/dL (ref 8.7–10.2)
Chloride: 103 mmol/L (ref 96–106)
Creatinine: 0.6 mg/dL (ref 0.57–1.00)
Globulin, Total: 2.9 g/dL (ref 1.5–4.5)
Glucose: 79 mg/dL (ref 70–99)
Potassium: 3.9 mmol/L (ref 3.5–5.2)
Protein, Total: 7.2 g/dL (ref 6.0–8.5)
Sodium: 138 mmol/L (ref 134–144)
eGFR: 111 mL/min/1.73 (ref >59)

## 2024-07-09 LAB — LIPID PANEL
Cholesterol / HDL Ratio: 3.1 ratio (ref 0.0–4.4)
Cholesterol: 175 mg/dL (ref 100–199)
HDL: 57 mg/dL (ref >39)
LDL Chol Calculated (NIH): 96 mg/dL (ref 0–99)
Triglycerides: 122 mg/dL (ref 0–149)
VLDL Calculated: 22 mg/dL (ref 5–40)

## 2024-07-09 LAB — HEMOGLOBIN A1C: Hemoglobin A1C: 5.6 % (ref 4.8–5.6)

## 2024-07-09 LAB — THYROID CASCADE PROFILE: TSH: 1.68 u[IU]/mL (ref 0.450–4.500)
